# Patient Record
Sex: Female | Born: 1980 | Race: White | Hispanic: No | Marital: Married
Health system: Southern US, Community
[De-identification: ages and names within clinical notes are randomized; demographics above are authoritative.]

## PROBLEM LIST (undated history)

## (undated) ENCOUNTER — Emergency Department (HOSPITAL_COMMUNITY): Admission: EM | Payer: 59 | Source: Home / Self Care

## (undated) ENCOUNTER — Inpatient Hospital Stay (HOSPITAL_COMMUNITY): Payer: Self-pay

## (undated) DIAGNOSIS — R569 Unspecified convulsions: Secondary | ICD-10-CM

## (undated) DIAGNOSIS — G43909 Migraine, unspecified, not intractable, without status migrainosus: Secondary | ICD-10-CM

## (undated) DIAGNOSIS — N189 Chronic kidney disease, unspecified: Secondary | ICD-10-CM

## (undated) DIAGNOSIS — H52209 Unspecified astigmatism, unspecified eye: Secondary | ICD-10-CM

## (undated) DIAGNOSIS — K5090 Crohn's disease, unspecified, without complications: Secondary | ICD-10-CM

## (undated) DIAGNOSIS — G40909 Epilepsy, unspecified, not intractable, without status epilepticus: Secondary | ICD-10-CM

## (undated) DIAGNOSIS — N200 Calculus of kidney: Secondary | ICD-10-CM

## (undated) DIAGNOSIS — L309 Dermatitis, unspecified: Secondary | ICD-10-CM

## (undated) DIAGNOSIS — D649 Anemia, unspecified: Secondary | ICD-10-CM

## (undated) DIAGNOSIS — K8020 Calculus of gallbladder without cholecystitis without obstruction: Secondary | ICD-10-CM

## (undated) HISTORY — DX: Dermatitis, unspecified: L30.9

## (undated) HISTORY — DX: Unspecified astigmatism, unspecified eye: H52.209

## (undated) HISTORY — DX: Calculus of kidney: N20.0

## (undated) HISTORY — DX: Migraine, unspecified, not intractable, without status migrainosus: G43.909

---
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
## 2003-03-14 ENCOUNTER — Other Ambulatory Visit: Admission: RE | Admit: 2003-03-14 | Discharge: 2003-03-14 | Payer: Self-pay | Admitting: *Deleted

## 2004-02-22 ENCOUNTER — Ambulatory Visit (HOSPITAL_COMMUNITY): Admission: RE | Admit: 2004-02-22 | Discharge: 2004-02-22 | Payer: Self-pay | Admitting: Gastroenterology

## 2004-02-22 ENCOUNTER — Encounter (INDEPENDENT_AMBULATORY_CARE_PROVIDER_SITE_OTHER): Payer: Self-pay | Admitting: Specialist

## 2004-02-29 ENCOUNTER — Encounter: Admission: RE | Admit: 2004-02-29 | Discharge: 2004-02-29 | Payer: Self-pay | Admitting: Gastroenterology

## 2004-03-04 ENCOUNTER — Emergency Department (HOSPITAL_COMMUNITY): Admission: EM | Admit: 2004-03-04 | Discharge: 2004-03-04 | Payer: Self-pay | Admitting: *Deleted

## 2004-03-05 ENCOUNTER — Inpatient Hospital Stay (HOSPITAL_COMMUNITY): Admission: AD | Admit: 2004-03-05 | Discharge: 2004-03-09 | Payer: Self-pay | Admitting: Gastroenterology

## 2004-09-19 ENCOUNTER — Encounter: Admission: RE | Admit: 2004-09-19 | Discharge: 2004-09-19 | Payer: Self-pay | Admitting: Gastroenterology

## 2006-11-02 ENCOUNTER — Emergency Department (HOSPITAL_COMMUNITY): Admission: EM | Admit: 2006-11-02 | Discharge: 2006-11-03 | Payer: Self-pay | Admitting: *Deleted

## 2007-03-23 ENCOUNTER — Ambulatory Visit: Payer: Self-pay | Admitting: Internal Medicine

## 2007-03-23 LAB — CONVERTED CEMR LAB
ALT: 10 units/L (ref 0–40)
AST: 10 units/L (ref 0–37)
Albumin: 2.7 g/dL — ABNORMAL LOW (ref 3.5–5.2)
Alkaline Phosphatase: 61 units/L (ref 39–117)
Anti Nuclear Antibody(ANA): NEGATIVE
BUN: 3 mg/dL — ABNORMAL LOW (ref 6–23)
Basophils Absolute: 0.1 10*3/uL (ref 0.0–0.1)
Basophils Relative: 1.4 % — ABNORMAL HIGH (ref 0.0–1.0)
Bilirubin, Direct: 0.1 mg/dL (ref 0.0–0.3)
CO2: 29 meq/L (ref 19–32)
Calcium: 9.1 mg/dL (ref 8.4–10.5)
Chloride: 103 meq/L (ref 96–112)
Cholesterol: 184 mg/dL (ref 0–200)
Creatinine, Ser: 0.6 mg/dL (ref 0.4–1.2)
Crystals: NEGATIVE
Eosinophils Absolute: 1.4 10*3/uL — ABNORMAL HIGH (ref 0.0–0.6)
Eosinophils Relative: 15.9 % — ABNORMAL HIGH (ref 0.0–5.0)
Folate: 7.7 ng/mL
GFR calc Af Amer: 159 mL/min
GFR calc non Af Amer: 132 mL/min
Glucose, Bld: 102 mg/dL — ABNORMAL HIGH (ref 70–99)
HCT: 30.6 % — ABNORMAL LOW (ref 36.0–46.0)
HDL: 54 mg/dL (ref >39.0)
Hemoglobin, Urine: NEGATIVE
Hemoglobin: 9.9 g/dL — ABNORMAL LOW (ref 12.0–15.0)
Iron: 15 ug/dL — ABNORMAL LOW (ref 42–145)
LDL Cholesterol: 116 mg/dL — ABNORMAL HIGH (ref 0–99)
Lymphocytes Relative: 13 % (ref 12.0–46.0)
MCHC: 32.3 g/dL (ref 30.0–36.0)
MCV: 76.5 fL — ABNORMAL LOW (ref 78.0–100.0)
Monocytes Absolute: 1 10*3/uL — ABNORMAL HIGH (ref 0.2–0.7)
Monocytes Relative: 12.2 % — ABNORMAL HIGH (ref 3.0–11.0)
Neutro Abs: 4.9 10*3/uL (ref 1.4–7.7)
Neutrophils Relative %: 57.5 % (ref 43.0–77.0)
Nitrite: NEGATIVE
Platelets: 773 10*3/uL — ABNORMAL HIGH (ref 150–400)
Potassium: 4.5 meq/L (ref 3.5–5.1)
RBC: 4 M/uL (ref 3.87–5.11)
RDW: 15.2 % — ABNORMAL HIGH (ref 11.5–14.6)
Rheumatoid fact SerPl-aCnc: <20 intl units/mL — ABNORMAL LOW (ref 0.0–20.0)
Saturation Ratios: 8.4 % — ABNORMAL LOW (ref 20.0–50.0)
Sed Rate: 79 mm/hr — ABNORMAL HIGH (ref 0–25)
Sodium: 138 meq/L (ref 135–145)
Specific Gravity, Urine: 1.01 (ref 1.000–1.03)
TSH: 1.04 microintl units/mL (ref 0.35–5.50)
Total Bilirubin: 0.4 mg/dL (ref 0.3–1.2)
Total CHOL/HDL Ratio: 3.4
Total Protein: 7.3 g/dL (ref 6.0–8.3)
Transferrin: 127.4 mg/dL — ABNORMAL LOW (ref >=212.0)
Triglycerides: 68 mg/dL (ref 0–149)
Urine Glucose: NEGATIVE mg/dL
Urobilinogen, UA: 0.2 (ref 0.0–1.0)
VLDL: 14 mg/dL (ref 0–40)
Vitamin B-12: 407 pg/mL (ref 211–911)
WBC: 8.5 10*3/uL (ref 4.5–10.5)
ds DNA Ab: 1 (ref <5)
pH: 8 (ref 5.0–8.0)

## 2007-04-22 ENCOUNTER — Ambulatory Visit: Payer: Self-pay | Admitting: Internal Medicine

## 2007-04-22 LAB — CONVERTED CEMR LAB
ALT: 7 units/L (ref 0–35)
AST: 11 units/L (ref 0–37)
Albumin: 2.7 g/dL — ABNORMAL LOW (ref 3.5–5.2)
Alkaline Phosphatase: 52 units/L (ref 39–117)
Basophils Absolute: 0.1 10*3/uL (ref 0.0–0.1)
Basophils Relative: 1.5 % — ABNORMAL HIGH (ref 0.0–1.0)
Bilirubin, Direct: 0.1 mg/dL (ref 0.0–0.3)
Eosinophils Absolute: 1 10*3/uL — ABNORMAL HIGH (ref 0.0–0.6)
Eosinophils Relative: 15.8 % — ABNORMAL HIGH (ref 0.0–5.0)
HCT: 28.6 % — ABNORMAL LOW (ref 36.0–46.0)
Hemoglobin: 9.1 g/dL — ABNORMAL LOW (ref 12.0–15.0)
Iron: 50 ug/dL (ref 42–145)
Lymphocytes Relative: 17.2 % (ref 12.0–46.0)
MCHC: 31.9 g/dL (ref 30.0–36.0)
MCV: 78.8 fL (ref 78.0–100.0)
Monocytes Absolute: 0.8 10*3/uL — ABNORMAL HIGH (ref 0.2–0.7)
Monocytes Relative: 12 % — ABNORMAL HIGH (ref 3.0–11.0)
Neutro Abs: 3.3 10*3/uL (ref 1.4–7.7)
Neutrophils Relative %: 53.5 % (ref 43.0–77.0)
Platelets: 721 10*3/uL — ABNORMAL HIGH (ref 150–400)
RBC: 3.63 M/uL — ABNORMAL LOW (ref 3.87–5.11)
RDW: 18.6 % — ABNORMAL HIGH (ref 11.5–14.6)
Saturation Ratios: 27.5 % (ref 20.0–50.0)
Total Bilirubin: 0.3 mg/dL (ref 0.3–1.2)
Total Protein: 6.9 g/dL (ref 6.0–8.3)
Transferrin: 129.8 mg/dL — ABNORMAL LOW (ref >=212.0)
WBC: 6.3 10*3/uL (ref 4.5–10.5)

## 2007-04-28 ENCOUNTER — Encounter: Payer: Self-pay | Admitting: Internal Medicine

## 2007-04-28 DIAGNOSIS — K5090 Crohn's disease, unspecified, without complications: Secondary | ICD-10-CM | POA: Insufficient documentation

## 2007-04-28 DIAGNOSIS — D509 Iron deficiency anemia, unspecified: Secondary | ICD-10-CM | POA: Insufficient documentation

## 2007-04-28 DIAGNOSIS — R569 Unspecified convulsions: Secondary | ICD-10-CM

## 2007-04-28 DIAGNOSIS — R519 Headache, unspecified: Secondary | ICD-10-CM | POA: Insufficient documentation

## 2007-04-28 DIAGNOSIS — R51 Headache: Secondary | ICD-10-CM

## 2007-04-28 DIAGNOSIS — K219 Gastro-esophageal reflux disease without esophagitis: Secondary | ICD-10-CM | POA: Insufficient documentation

## 2007-05-11 ENCOUNTER — Ambulatory Visit: Payer: Self-pay | Admitting: Internal Medicine

## 2007-08-06 ENCOUNTER — Other Ambulatory Visit: Admission: RE | Admit: 2007-08-06 | Discharge: 2007-08-06 | Payer: Self-pay | Admitting: Obstetrics & Gynecology

## 2007-09-30 LAB — CONVERTED CEMR LAB: Pap Smear: NORMAL

## 2008-03-11 ENCOUNTER — Emergency Department (HOSPITAL_COMMUNITY): Admission: EM | Admit: 2008-03-11 | Discharge: 2008-03-11 | Payer: Self-pay | Admitting: Emergency Medicine

## 2008-07-05 ENCOUNTER — Ambulatory Visit: Payer: Self-pay | Admitting: Internal Medicine

## 2008-07-05 DIAGNOSIS — M25519 Pain in unspecified shoulder: Secondary | ICD-10-CM | POA: Insufficient documentation

## 2008-10-05 ENCOUNTER — Ambulatory Visit: Payer: Self-pay | Admitting: Internal Medicine

## 2008-10-05 DIAGNOSIS — J209 Acute bronchitis, unspecified: Secondary | ICD-10-CM

## 2008-10-11 ENCOUNTER — Ambulatory Visit (HOSPITAL_COMMUNITY): Admission: RE | Admit: 2008-10-11 | Discharge: 2008-10-11 | Payer: Self-pay

## 2009-01-27 LAB — CONVERTED CEMR LAB: Pap Smear: NORMAL

## 2010-01-07 ENCOUNTER — Ambulatory Visit: Payer: Self-pay | Admitting: Internal Medicine

## 2010-01-07 DIAGNOSIS — N63 Unspecified lump in unspecified breast: Secondary | ICD-10-CM

## 2010-01-07 DIAGNOSIS — H538 Other visual disturbances: Secondary | ICD-10-CM

## 2010-01-07 LAB — CONVERTED CEMR LAB
ALT: 13 units/L (ref 0–35)
AST: 18 units/L (ref 0–37)
Albumin: 4.4 g/dL (ref 3.5–5.2)
Alkaline Phosphatase: 30 units/L — ABNORMAL LOW (ref 39–117)
Basophils Absolute: 0.1 10*3/uL (ref 0.0–0.1)
Basophils Relative: 1 % (ref 0.0–3.0)
Bilirubin Urine: NEGATIVE
Bilirubin, Direct: 0.1 mg/dL (ref 0.0–0.3)
CO2: 30 meq/L (ref 19–32)
Chloride: 99 meq/L (ref 96–112)
Creatinine, Ser: 0.6 mg/dL (ref 0.4–1.2)
Eosinophils Absolute: 0.2 10*3/uL (ref 0.0–0.7)
Eosinophils Relative: 2.2 % (ref 0.0–5.0)
Folate: 18.8 ng/mL
GFR calc non Af Amer: 128.01 mL/min (ref >60)
Glucose, Bld: 83 mg/dL (ref 70–99)
HCT: 35.5 % — ABNORMAL LOW (ref 36.0–46.0)
HDL: 83.5 mg/dL (ref >39.00)
Hemoglobin: 11.9 g/dL — ABNORMAL LOW (ref 12.0–15.0)
Iron: 52 ug/dL (ref 42–145)
Ketones, ur: NEGATIVE mg/dL
Leukocytes, UA: NEGATIVE
Lymphocytes Relative: 21.1 % (ref 12.0–46.0)
Lymphs Abs: 1.6 10*3/uL (ref 0.7–4.0)
MCHC: 33.6 g/dL (ref 30.0–36.0)
MCV: 94.6 fL (ref 78.0–100.0)
Monocytes Absolute: 0.6 10*3/uL (ref 0.1–1.0)
Monocytes Relative: 8.4 % (ref 3.0–12.0)
Neutro Abs: 5.1 10*3/uL (ref 1.4–7.7)
Neutrophils Relative %: 67.3 % (ref 43.0–77.0)
Nitrite: NEGATIVE
Potassium: 3.3 meq/L — ABNORMAL LOW (ref 3.5–5.1)
RBC: 3.75 M/uL — ABNORMAL LOW (ref 3.87–5.11)
RDW: 15.5 % — ABNORMAL HIGH (ref 11.5–14.6)
Saturation Ratios: 14.7 % — ABNORMAL LOW (ref 20.0–50.0)
Sed Rate: 19 mm/hr (ref 0–22)
Sodium: 138 meq/L (ref 135–145)
Specific Gravity, Urine: >=1.03 (ref 1.000–1.030)
Total Bilirubin: 0.6 mg/dL (ref 0.3–1.2)
Total CHOL/HDL Ratio: 2
Total Protein, Urine: NEGATIVE mg/dL
Total Protein: 8.3 g/dL (ref 6.0–8.3)
Transferrin: 252.2 mg/dL (ref 212.0–360.0)
Triglycerides: 38 mg/dL (ref 0.0–149.0)
Urine Glucose: NEGATIVE mg/dL
Urobilinogen, UA: 0.2 (ref 0.0–1.0)
VLDL: 7.6 mg/dL (ref 0.0–40.0)
Vitamin B-12: 233 pg/mL (ref 211–911)
WBC: 7.6 10*3/uL (ref 4.5–10.5)
pH: 5 (ref 5.0–8.0)

## 2010-01-09 LAB — CONVERTED CEMR LAB
Chlamydia, Swab/Urine, PCR: NEGATIVE
GC Probe Amp, Urine: NEGATIVE

## 2010-01-14 ENCOUNTER — Encounter: Admission: RE | Admit: 2010-01-14 | Discharge: 2010-01-14 | Payer: Self-pay | Admitting: Internal Medicine

## 2010-01-28 ENCOUNTER — Encounter: Payer: Self-pay | Admitting: Internal Medicine

## 2010-10-29 NOTE — Assessment & Plan Note (Signed)
Summary: CPX / NWS  #   Vital Signs:  Patient profile:   30 year old female Height:      59 inches Weight:      105.75 pounds BMI:     21.44 O2 Sat:      99 % on Room air Temp:     98.2 degrees F oral Pulse rate:   74 / minute BP sitting:   100 / 60  (left arm) Cuff size:   regular  Vitals Entered ByZella Ball Ewing (January 07, 2010 1:06 PM)  O2 Flow:  Room air  Preventive Care Screening  Pap Smear:    Date:  09/30/2007    Results:  normal   Last Tetanus Booster:    Date:  12/29/2002    Results:  Tdap   CC: Adult Physical/RE   CC:  Adult Physical/RE.  History of Present Illness: gets cbc every 3 mo with GI MD - on humira and immuran;  crohn's doing well;  no overt bleeding or pain or diarrhea, no wt loss.  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    Problems Prior to Update: 1)  Breast Mass, Right  (ICD-611.72) 2)  Preventive Health Care  (ICD-V70.0) 3)  Sexually Transmitted Disease, Exposure To  (ICD-V01.6) 4)  Asthmatic Bronchitis, Acute  (ICD-466.0) 5)  Shoulder Pain, Right  (ICD-719.41) 6)  Crohn's Disease  (ICD-555.9) 7)  Seizure Disorder  (ICD-780.39) 8)  Headache  (ICD-784.0) 9)  Gerd  (ICD-530.81) 10)  Anemia-iron Deficiency  (ICD-280.9)  Medications Prior to Update: 1)  Tegretol Xr 100 Mg Xr12h-Tab (Carbamazepine) .Marland Kitchen.. 1 1/2 Tabs By Mouth Bid 2)  Humira 20 Mg/0.55ml Kit (Adalimumab) .... Inj Q 2 Weeks 3)  Imuran 50 Mg Tabs  (Azathioprine) .... 2 By Mouth Daily 4)  Cephalexin 500 Mg Tabs (Cephalexin) .Marland Kitchen.. 1 By Mouth Three Times A Day 5)  Prednisone 10 Mg Tabs (Prednisone) .... 3po Qd For 3days, Then 2po Qd For 3days, Then 1po Qd For 3days, Then Stop 6)  Tessalon Perles 100 Mg Caps (Benzonatate) .Marland Kitchen.. 1 -2 By Mouth Three Times A Day As Needed Cough  Current Medications (verified): 1)  Tegretol Xr 100 Mg Xr12h-Tab (Carbamazepine) .Marland Kitchen.. 1 1/2 Tabs By Mouth Bid 2)  Humira 20 Mg/0.56ml Kit (Adalimumab) .... Inj Q 2 Weeks 3)  Imuran 50 Mg Tabs (Azathioprine) .... 2 By Mouth Daily 4)  Cephalexin 500 Mg Tabs (Cephalexin) .Marland Kitchen.. 1 By Mouth Three Times A Day 5)  Prednisone 10 Mg Tabs (Prednisone) .... 3po Qd For 3days, Then 2po Qd For 3days, Then 1po Qd For 3days, Then Stop 6)  Tessalon Perles 100 Mg Caps (Benzonatate) .Marland Kitchen.. 1 -2 By Mouth Three Times A Day As Needed Cough 7)  Lamictal Xr 200 Mg Xr24h-Tab (Lamotrigine) .Marland Kitchen.. 1 By Mouth Once Daily 8)  Loestrin 24 Fe 1-20 Mg-Mcg Tabs (  Norethin Ace-Eth Estrad-Fe) .Marland Kitchen.. 1 By Mouth Once Daily  Allergies (verified): 1)  ! 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 Denies surgical history  Family History: Reviewed history and no changes required. p-grandmother died with stroke p-grandfather with MI at 56yo paternal - mult with HTN, elev chol maternal - - several with heart disease, elev  chol  Social History: Reviewed history from 07/05/2008 and no changes required. 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         all otherwise negative per pt -    Physical Exam  General:  alert and well-developed.   Head:  normocephalic and atraumatic.   Eyes:  vision grossly intact, pupils equal, and pupils round.   Ears:  R ear normal and L ear normal.   Nose:  no external deformity and no nasal discharge.   Mouth:  no gingival abnormalities and pharynx pink and moist.   Neck:  supple and no masses.   Breasts:  right and left breasts with prob cystic changes multiple, with one tender prob cystic area to the right upper outer quad Lungs:  normal respiratory effort and normal breath sounds.   Heart:  normal rate and regular rhythm.   Abdomen:  soft, non-tender, and normal bowel sounds.   Msk:  no joint tenderness and no joint swelling.   Extremities:  no edema, no erythema  Neurologic:  cranial nerves II-XII intact and strength normal in all extremities.   Skin:  color normal and no rashes.   Psych:  normally interactive and moderately anxious.     Impression & Recommendations:  Problem # 1:  Preventive Health Care (ICD-V70.0)  Overall doing well, age appropriate education and counseling updated and referral for appropriate preventive services done unless declined, immunizations up to date or declined, diet counseling done if overweight, urged to quit smoking if smokes ,  most recent labs reviewed and current ordered if appropriate, ecg reviewed or declined (interpretation per ECG scanned in the EMR if done); information regarding Medicare Prevention requirements given if appropriate  Orders: TLB-BMP (Basic Metabolic Panel-BMET) (80048-METABOL) TLB-CBC Platelet - w/Differential (85025-CBCD) TLB-Hepatic/Liver Function Pnl (80076-HEPATIC) TLB-Lipid Panel (80061-LIPID) TLB-TSH (Thyroid Stimulating Hormone) (84443-TSH) TLB-Udip ONLY (81003-UDIP)  Problem # 2:  SEXUALLY TRANSMITTED DISEASE, EXPOSURE TO (ICD-V01.6) to check per pt Orders: T-HIV-1 (Screen) [id number]) T-RPR (Syphilis) [phone]) T-Chlamydia & GC Probe, Urine ([id number]) T-Herpes Simplex Type 2 (56213-08657)  Problem # 3:  BREAST MASS, RIGHT (ICD-611.72)  exam c/w prob fibrocystic changes;  with tendern mass to the right;  will ask for diag mammogram with u/s with an eye towards possible  aspiration if needed;  she became tearful and emotional with this suggestionl, I tried to reassure this is very very unlikely to be malignancy  Orders: Radiology Referral (Radiology)  Problem # 4:  BLURRED VISION (ICD-368.8)  pt reqeusts second opinion  Orders: Ophthalmology Referral (Ophthalmology)  Complete Medication List: 1)  Tegretol Xr 100 Mg Xr12h-tab (Carbamazepine) .Marland Kitchen.. 1 1/2 tabs by mouth bid 2)  Humira 20 Mg/0.31ml Kit (Adalimumab) .... Inj q 2 weeks 3)  Imuran 50 Mg Tabs (Azathioprine) .... 2 by mouth daily 4)  Cephalexin 500 Mg Tabs (Cephalexin) .Marland Kitchen.. 1 by mouth three times a day 5)  Prednisone 10 Mg Tabs (Prednisone) .... 3po qd for 3days, then 2po qd for 3days, then 1po qd for 3days, then stop 6)  Tessalon Perles 100 Mg Caps (Benzonatate) .Marland Kitchen.. 1 -2 by mouth three times a day as needed cough 7)  Lamictal Xr 200 Mg Xr24h-tab (Lamotrigine) .Marland Kitchen.. 1 by mouth once daily 8)  Loestrin 24 Fe 1-20 Mg-mcg Tabs (Norethin ace-eth estrad-fe) .Marland Kitchen.. 1 by mouth once daily  Other Orders: TLB-B12 +  Folate Pnl ([id number]) TLB-IBC Pnl (Iron/FE;Transferrin) (83550-IBC) TLB-Sedimentation Rate (ESR) (85652-ESR)  Patient Instructions: 1)  Please go to the Lab in the basement for your blood and/or urine tests today 2)  You will be contacted about the referral(s) to: diagnostic mammogram  (to look into the probable Fibrocystic breast changes), and the opthomologist 3)  Please schedule a follow-up appointment in 1 year or sooner if needed

## 2010-10-29 NOTE — Consult Note (Signed)
Summary: [facility]  [facility]   Imported By: Bubba Hales 01/30/2010 09:01:43  _____________________________________________________________________  External Attachment:    Type:   Image     Comment:   External Document

## 2011-02-14 NOTE — Consult Note (Signed)
NAME:  Desiree Marshall, Desiree Marshall                         ACCOUNT NO.:  [id number]   MEDICAL RECORD NO.:  [id number]                   PATIENT TYPE:  INP   LOCATION:  3006                                 FACILITY:  [facility]   PHYSICIAN:  Nelwyn Salisbury, M.D.               DATE OF BIRTH:  17-Nov-1980   DATE OF CONSULTATION:  03/08/2004  DATE OF DISCHARGE:  03/09/2004                                   CONSULTATION   REASON FOR CONSULTATION:  Severe Crohn's colitis with acute flareup and  dehydration with nausea and vomiting, fevers up to 103.   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  She was treated by Nida Boatman Dike,  P.A. for IBS and was given Levbid; however, simple measures did not help her  and she was therefore referred for further evaluation.  She describes loose  mucoid stools since December of 2003 with decreased appetite.  She has  attributed this to IBS as she had a stressful year at school this year.  These symptoms were associated with lower abdominal cramping and bloating.  She is noted low grade fevers in the past but over the weekend developed a  fever of 104 after she was started on Pentasa.  She was seen in the  emergency room yesterday where a CT scan showed terminal ileitis in the  bowels and small intestines involved with Crohn's disease.  She was  therefore admitted for IV steroids as she could not keep fluids down.  She  also has a history of headaches for which she takes Aleve and a history of  seizure disorder since age 56 for which she is on Tegretol 300 mg b.i.d.  followed by Dr. Princess Bruins. [place].   ALLERGIES:  No known drug allergies.   MEDICATIONS:  Aleve, Allegra, Tegretol, and Levbid.   PAST MEDICAL HISTORY:  1. History of back pain and back spasm.  2. History of epilepsy.  3. History of Crohn's colitis diagnosed  in the recent past.   PAST SURGICAL HISTORY:  The patient has had no surgeries.   SOCIAL HISTORY:  She is a Presenter, broadcasting at [employer] and lives with her  brother.  Her parents live in [place].  She has a very supportive family.  She exercises regularly one to three times per week.  She denies the use of  alcohol, tobacco, or drugs.  She is not married and has no children.  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   FAMILY HISTORY:  History of colonic polyps associated with her father.  MI  associated with a paternal grandfather.  There is no known family history of  colon, breast, ovarian, cervical, or uterine cancer.  There is no family  history of  IBD.   REVIEW OF SYMPTOMS:  Nausea and vomiting.  Fevers up to 104.  Diarrhea with  mucoid stool.  Acute abdominal pain.  No history of melena or hematochezia.   PHYSICAL EXAMINATION:  GENERAL:  A very pleasant, young, anxious, and  dehydrated-appearing white female in no acute distress.  VITAL SIGNS:  Temperature of 98.6, blood pressure 110/70, pulse of 125 per  minute, and respiratory rate of 18.  HEENT:  TMs sequelae.  The patient's oropharyngeal mucosa without exudate.  NECK:  Supple.  No JVD, thyromegaly, or lymphadenopathy.  CHEST:  Clear to auscultation.  HEART:  S1 and S2 regular.  The patient is tachycardic with no rales,  rhonchi, or wheezing.  ABDOMEN:  Soft.  Diffusely tender with no hepatosplenomegaly.  RECTAL:  Deferred.   LABORATORY DATA:  Deferred until the morning.  CT scan done yesterday showed  thickening of the terminal ileum and other loop of small bowel.  Colonoscopy  done in the recent past showed pancolitis from Crohn's disease.  Terminal  ileus was not visualized during colonoscopy.   ASSESSMENT/PLAN:  1. Crohn's colitis with small bowel involvement:  We will start intravenous     Solu-Medrol 30 mg q.6h. tonight, along with intravenous Zestril for     symptomatic relief.  Give the patient two  to three liters of fluid to     stabilize her symptoms.  Further recommendation made thereafter.  Pentasa     will be added once the patient is more stable on Solu-Medrol.  2. Seizure disorder on Tegretol 300 mg b.i.d.  3. History of headaches on Aleve.  All nonsteroidals are discouraged for     now.                                               Nelwyn Salisbury, M.D.    JNM/MEDQ  D:  03/08/2004  T:  03/09/2004  Job:  [id number]   cc:   Suszanne Conners, M.D.  [place]  [place] [address]  Fax: [phone]   Princess Bruins. Gaynell Face, M.D.  [address] [place] [address]  Fax: [phone]

## 2011-02-14 NOTE — Op Note (Signed)
NAME:  Desiree Marshall, Desiree Marshall                         ACCOUNT NO.:  [id number]   MEDICAL RECORD NO.:  [id number]                   PATIENT TYPE:  AMB   LOCATION:  ENDO                                 FACILITY:  [facility]   PHYSICIAN:  Nelwyn Salisbury, M.D.               DATE OF BIRTH:  1981-04-11   DATE OF PROCEDURE:  02/22/2004  DATE OF DISCHARGE:                                 OPERATIVE REPORT   PROCEDURE PERFORMED:  Colonoscopy with multiple biopsies.   ENDOSCOPIST:  Juanita Craver, M.D.   INSTRUMENT USED:  Olympus adjustable pediatric video colonoscope.   INDICATIONS FOR PROCEDURE:  Change in bowel habits with blood in stool and  worsening diarrhea in a 30 year old white female with an elevated ESR of 60  and anemia with hemoglobin of 9.3 gm/dl.  Rule out inflammatory bowel  disease, colonic polyps, etc.   PREPROCEDURE PREPARATION:  Informed consent was procured from the patient.  The patient was fasted for eight hours prior to the procedure and prepped  with a bottle of magnesium citrate and a gallon of GoLYTELY the night prior  to the procedure.   PREPROCEDURE PHYSICAL:  The patient had stable vital signs.  Neck supple.  Chest clear to auscultation.  S1 and S2 regular.  Abdomen soft with normal  bowel sounds.   DESCRIPTION OF PROCEDURE:  The patient was placed in left lateral decubitus  position and sedated with 100 mg of Demerol and 10 mg of Versed in slow  incremental doses.  Once the patient was adequately sedated and maintained  on low flow oxygen and continuous cardiac monitoring, the Olympus video  colonoscope was advanced from the rectum to the cecum without difficulty.  There was some residual stool in the colon especially in the cecum and on  the right side of the colon.  Patchy ulceration with exudate was noted  throughout the colon and multiple biopsies were done.  The rectum from 0 to  10 cm seemed to be spared of these changes.  The terminal ileum was not  visualized in  spite of several efforts to do so.  Retroflexion in the rectum  revealed no abnormalities.  The patient tolerated the procedure well without  immediate complications.   IMPRESSION:  1. Patchy ulceration throughout the colon consistent with Crohn's colitis.     Normal-appearing mucosa from 0 to 10 cm.  2. Random biopsies done, results pending.  3. Residual stool in the right colon, terminal ileum not visualized.   RECOMMENDATIONS:  1. An upper GI with small bowel follow-through is planned to further     evaluate the rest of the GI tract with regard to Crohn's disease.  2. Await pathology results.  3. Avoid all nonsteroidals for now.  4. Outpatient followup within one week for further recommendations.  Nelwyn Salisbury, M.D.    JNM/MEDQ  D:  02/22/2004  T:  02/23/2004  Job:  [id number]   cc:   Suszanne Conners, M.D.  [place]  [place] [address]  Fax: [phone]

## 2011-02-14 NOTE — Discharge Summary (Signed)
NAME:  Desiree Marshall, Desiree Marshall                         ACCOUNT NO.:  [id number]   MEDICAL RECORD NO.:  [id number]                   PATIENT TYPE:  INP   LOCATION:  3006                                 FACILITY:  [facility]   PHYSICIAN:  Nelwyn Salisbury, M.D.               DATE OF BIRTH:  [birth year]   DATE OF ADMISSION:  03/05/2004  DATE OF DISCHARGE:  03/09/2004                                 DISCHARGE SUMMARY   REASON FOR ADMISSION:  1. Flare-up of Crohn's colitis.  2. History of epilepsy, on Tegretol.  3. History of back pain and spasms.  4. History of headaches.   DISCHARGE DIAGNOSES:  1. Crohn's colitis.  2. Epilepsy.  3. Back pain, improved.  4. History of migraine headaches.   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  She subsequently underwent a colonoscopy with biopsies which  proved her condition to be secondary to Crohn's disease.  She had been  treated with antispasmodics for presumed IBS the year prior to her  diagnosis.  However, her symptoms worsened.  She was tried on Pentasa and  seemed to have an allergic reaction to it.  She was therefore advised to  come to the emergency room.  A day prior to admission the CAT scan revealed  ileitis with __________ Crohn's disease and the patient had severe nausea  and abdominal discomfort after starting the first dose of Pentasa.  However,  she was not sure whether this was from her headache or from the Crohn's.  She was therefore hospitalized on March 05, 2004 for IV fluids and a challenge  with Pentasa.  She did well and her symptoms gradually improved on Solu-  Medrol.  She was continued on Tegretol for her seizure disorder and was  encouraged to avoid steroids as these might worsen her Crohn's colitis.  She  seemed to have intolerance for Phenergan and therefore Zofran was used.  On  admission her labs revealed a  white count of 7.3, hemoglobin of 8.6,  hematocrit 26.7, and platelets of 573.  She was maintained on Protonix and  before discharge was started on ferrous sulfate for iron deficiency anemia.  She was to take 324 mg three times a day.  The patient's abdominal pain  improved and so did the nausea.  Pentasa was given to her three times a day  and she was discharged on March 09, 2004.   DISCHARGE MEDICATIONS:  1. Tegretol 300 mg b.i.d.  2. Pentasa 250 mg two pills three times a day.  3. Tylenol p.r.n. for headaches.  4. Prednisone taper starting at 40 mg per day to be decreased to 10 mg per     day over the next 4 weeks.  5. Ferrous sulfate 324 mg three times a day.  DISCHARGE INSTRUCTIONS:  The patient is to follow up with me in the office  within the next 2 weeks.  To call the office earlier if she is having  problems in the interim.  I have contacted her attending at [facility] and  informed her about her condition so that she does not suffer with regards to  her nursing education.  Further recommendations will be made on outpatient  follow-up.                                                Nelwyn Salisbury, M.D.    JNM/MEDQ  D:  05/08/2004  T:  05/08/2004  Job:  [id number]   cc:   Suszanne Conners, M.D.  [place]  [place] [address]  Fax: [phone]   Princess Bruins. Gaynell Face, M.D.  [address] [place] [address]  Fax: [phone]

## 2011-04-08 DIAGNOSIS — K5090 Crohn's disease, unspecified, without complications: Secondary | ICD-10-CM | POA: Insufficient documentation

## 2011-05-02 DIAGNOSIS — K644 Residual hemorrhoidal skin tags: Secondary | ICD-10-CM | POA: Insufficient documentation

## 2011-06-26 ENCOUNTER — Other Ambulatory Visit: Payer: Self-pay | Admitting: Obstetrics and Gynecology

## 2011-06-26 LAB — POCT I-STAT, CHEM 8
BUN: 7
Calcium, Ion: 1.13
Chloride: 105
Creatinine, Ser: 0.5
Glucose, Bld: 93
HCT: 38
Hemoglobin: 12.9
Potassium: 3.7
Sodium: 136
TCO2: 18

## 2011-06-26 LAB — URINE MICROSCOPIC-ADD ON

## 2011-06-26 LAB — CBC
HCT: 34.2 — ABNORMAL LOW
Hemoglobin: 11.5 — ABNORMAL LOW
MCHC: 33.7
MCV: 83.4
Platelets: 392
RBC: 4.1
RDW: 17.4 — ABNORMAL HIGH
WBC: 4.6

## 2011-06-26 LAB — URINALYSIS, ROUTINE W REFLEX MICROSCOPIC
Bilirubin Urine: NEGATIVE
Glucose, UA: NEGATIVE
Ketones, ur: 15 — AB
Nitrite: NEGATIVE
Protein, ur: 30 — AB
Specific Gravity, Urine: 1.022
Urobilinogen, UA: 1
pH: 7.5

## 2011-06-26 LAB — DIFFERENTIAL
Basophils Absolute: 0.1
Basophils Relative: 2 — ABNORMAL HIGH
Eosinophils Absolute: 0.2
Eosinophils Relative: 5
Lymphocytes Relative: 32
Lymphs Abs: 1.5
Monocytes Absolute: 0.8
Monocytes Relative: 18 — ABNORMAL HIGH
Neutro Abs: 2
Neutrophils Relative %: 44

## 2011-06-26 LAB — CARBAMAZEPINE LEVEL, TOTAL: Carbamazepine Lvl: <2 — ABNORMAL LOW

## 2011-10-31 LAB — OB RESULTS CONSOLE GC/CHLAMYDIA
Chlamydia: NEGATIVE
Gonorrhea: NEGATIVE

## 2011-10-31 LAB — OB RESULTS CONSOLE RUBELLA ANTIBODY, IGM: Rubella: IMMUNE

## 2011-10-31 LAB — OB RESULTS CONSOLE ANTIBODY SCREEN: Antibody Screen: NEGATIVE

## 2012-04-18 ENCOUNTER — Inpatient Hospital Stay (HOSPITAL_COMMUNITY): Payer: 59 | Admitting: Anesthesiology

## 2012-04-18 ENCOUNTER — Encounter (HOSPITAL_COMMUNITY): Payer: Self-pay | Admitting: *Deleted

## 2012-04-18 ENCOUNTER — Encounter (HOSPITAL_COMMUNITY): Payer: Self-pay | Admitting: Anesthesiology

## 2012-04-18 ENCOUNTER — Inpatient Hospital Stay (HOSPITAL_COMMUNITY)
Admission: AD | Admit: 2012-04-18 | Discharge: 2012-04-20 | DRG: 775 | Disposition: A | Discharge status: Home / Self Care | Payer: 59 | Source: Ambulatory Visit | Attending: Obstetrics and Gynecology | Admitting: Obstetrics and Gynecology

## 2012-04-18 DIAGNOSIS — O99354 Diseases of the nervous system complicating childbirth: Secondary | ICD-10-CM | POA: Diagnosis present

## 2012-04-18 DIAGNOSIS — O99892 Other specified diseases and conditions complicating childbirth: Secondary | ICD-10-CM | POA: Diagnosis present

## 2012-04-18 DIAGNOSIS — G40909 Epilepsy, unspecified, not intractable, without status epilepticus: Secondary | ICD-10-CM | POA: Diagnosis present

## 2012-04-18 DIAGNOSIS — Z3490 Encounter for supervision of normal pregnancy, unspecified, unspecified trimester: Secondary | ICD-10-CM

## 2012-04-18 DIAGNOSIS — O9902 Anemia complicating childbirth: Secondary | ICD-10-CM | POA: Diagnosis present

## 2012-04-18 DIAGNOSIS — K5090 Crohn's disease, unspecified, without complications: Secondary | ICD-10-CM | POA: Diagnosis present

## 2012-04-18 DIAGNOSIS — O4290 Premature rupture of membranes, unspecified as to length of time between rupture and onset of labor, unspecified weeks of gestation: Secondary | ICD-10-CM | POA: Diagnosis present

## 2012-04-18 DIAGNOSIS — D649 Anemia, unspecified: Secondary | ICD-10-CM | POA: Diagnosis present

## 2012-04-18 HISTORY — DX: Epilepsy, unspecified, not intractable, without status epilepticus: G40.909

## 2012-04-18 HISTORY — DX: Crohn's disease, unspecified, without complications: K50.90

## 2012-04-18 HISTORY — DX: Anemia, unspecified: D64.9

## 2012-04-18 LAB — CBC
HCT: 38.3 % (ref 36.0–46.0)
Hemoglobin: 12.6 g/dL (ref 12.0–15.0)
MCHC: 32.9 g/dL (ref 30.0–36.0)

## 2012-04-18 LAB — POCT FERN TEST: Fern Test: POSITIVE

## 2012-04-18 MED ORDER — ONDANSETRON HCL 4 MG/2ML IJ SOLN
4.0000 mg | Freq: Four times a day (QID) | INTRAMUSCULAR | Status: DC | PRN
  Administered 2012-04-18: 4 mg via INTRAVENOUS
  Filled 2012-04-18: qty 2

## 2012-04-18 MED ORDER — CITRIC ACID-SODIUM CITRATE 334-500 MG/5ML PO SOLN
30.0000 mL | ORAL | Status: DC | PRN
  Filled 2012-04-18: qty 15

## 2012-04-18 MED ORDER — PENICILLIN G POTASSIUM 5000000 UNITS IJ SOLR
2.5000 10*6.[IU] | INTRAVENOUS | Status: DC
  Administered 2012-04-18: 2.5 10*6.[IU] via INTRAVENOUS
  Filled 2012-04-18 (×4): qty 2.5

## 2012-04-18 MED ORDER — LIDOCAINE HCL (PF) 1 % IJ SOLN
30.0000 mL | INTRAMUSCULAR | Status: DC | PRN
  Filled 2012-04-18: qty 30

## 2012-04-18 MED ORDER — LACTATED RINGERS IV SOLN
500.0000 mL | Freq: Once | INTRAVENOUS | Status: AC
  Administered 2012-04-18: 500 mL via INTRAVENOUS

## 2012-04-18 MED ORDER — EPHEDRINE 5 MG/ML INJ
10.0000 mg | INTRAVENOUS | Status: DC | PRN
Start: 2012-04-18 — End: 2012-04-19

## 2012-04-18 MED ORDER — FLEET ENEMA 7-19 GM/118ML RE ENEM: 1.0000 | ENEMA | RECTAL | Status: DC | PRN

## 2012-04-18 MED ORDER — OXYCODONE-ACETAMINOPHEN 5-325 MG PO TABS: 1.0000 | ORAL_TABLET | ORAL | Status: DC | PRN

## 2012-04-18 MED ORDER — TERBUTALINE SULFATE 1 MG/ML IJ SOLN: 0.2500 mg | Freq: Once | INTRAMUSCULAR | Status: AC | PRN

## 2012-04-18 MED ORDER — LACTATED RINGERS IV SOLN
INTRAVENOUS | Status: DC
  Administered 2012-04-18 (×2): via INTRAVENOUS

## 2012-04-18 MED ORDER — PHENYLEPHRINE 40 MCG/ML (10ML) SYRINGE FOR IV PUSH (FOR BLOOD PRESSURE SUPPORT): 80.0000 ug | PREFILLED_SYRINGE | INTRAVENOUS | Status: DC | PRN

## 2012-04-18 MED ORDER — TERBUTALINE SULFATE 1 MG/ML IJ SOLN: 0.2500 mg | Freq: Once | INTRAMUSCULAR | Status: DC | PRN

## 2012-04-18 MED ORDER — PHENYLEPHRINE 40 MCG/ML (10ML) SYRINGE FOR IV PUSH (FOR BLOOD PRESSURE SUPPORT)
80.0000 ug | PREFILLED_SYRINGE | INTRAVENOUS | Status: DC | PRN
  Filled 2012-04-18: qty 5

## 2012-04-18 MED ORDER — OXYTOCIN 40 UNITS IN LACTATED RINGERS INFUSION - SIMPLE MED
1.0000 m[IU]/min | INTRAVENOUS | Status: DC
  Administered 2012-04-18: 2 m[IU]/min via INTRAVENOUS

## 2012-04-18 MED ORDER — EPHEDRINE 5 MG/ML INJ
10.0000 mg | INTRAVENOUS | Status: DC | PRN
  Filled 2012-04-18: qty 4

## 2012-04-18 MED ORDER — DIPHENHYDRAMINE HCL 50 MG/ML IJ SOLN: 12.5000 mg | INTRAMUSCULAR | Status: DC | PRN

## 2012-04-18 MED ORDER — LIDOCAINE HCL (PF) 1 % IJ SOLN
INTRAMUSCULAR | Status: DC | PRN
  Administered 2012-04-18 (×2): 5 mL

## 2012-04-18 MED ORDER — ACETAMINOPHEN 325 MG PO TABS: 650.0000 mg | ORAL_TABLET | ORAL | Status: DC | PRN

## 2012-04-18 MED ORDER — DEXTROSE 5 % IV SOLN
5.0000 10*6.[IU] | Freq: Once | INTRAVENOUS | Status: AC
  Administered 2012-04-18: 5 10*6.[IU] via INTRAVENOUS
  Filled 2012-04-18: qty 5

## 2012-04-18 MED ORDER — FENTANYL 2.5 MCG/ML BUPIVACAINE 1/10 % EPIDURAL INFUSION (WH - ANES)
14.0000 mL/h | INTRAMUSCULAR | Status: DC
  Administered 2012-04-18: 14 mL/h via EPIDURAL
  Administered 2012-04-18: 12 mL/h via EPIDURAL
  Filled 2012-04-18 (×2): qty 60

## 2012-04-18 MED ORDER — IBUPROFEN 600 MG PO TABS: 600.0000 mg | ORAL_TABLET | Freq: Four times a day (QID) | ORAL | Status: DC | PRN

## 2012-04-18 MED ORDER — OXYTOCIN BOLUS FROM INFUSION
250.0000 mL | Freq: Once | INTRAVENOUS | Status: DC
  Filled 2012-04-18: qty 500

## 2012-04-18 MED ORDER — LACTATED RINGERS IV SOLN: 500.0000 mL | INTRAVENOUS | Status: DC | PRN

## 2012-04-18 MED ORDER — OXYTOCIN 40 UNITS IN LACTATED RINGERS INFUSION - SIMPLE MED
62.5000 mL/h | Freq: Once | INTRAVENOUS | Status: DC
  Filled 2012-04-18: qty 1000

## 2012-04-18 NOTE — Anesthesia Procedure Notes (Signed)
Epidural Patient location during procedure: OB Start time: 04/18/2012 10:43 AM  Staffing Anesthesiologist: Brayton Caves R Performed by: anesthesiologist   Preanesthetic Checklist Completed: patient identified, site marked, surgical consent, pre-op evaluation, timeout performed, IV checked, risks and benefits discussed and monitors and equipment checked  Epidural Patient position: sitting Prep: site prepped and draped and DuraPrep Patient monitoring: continuous pulse ox and blood pressure Approach: midline Injection technique: LOR air and LOR saline  Needle:  Needle type: Tuohy  Needle gauge: 17 G Needle length: 9 cm Needle insertion depth: 5 cm cm Catheter type: closed end flexible Catheter size: 19 Gauge Catheter at skin depth: 10 cm Test dose: negative  Assessment Events: blood not aspirated, injection not painful, no injection resistance, negative IV test and no paresthesia  Additional Notes Patient identified.  Risk benefits discussed including failed block, incomplete pain control, headache, nerve damage, paralysis, blood pressure changes, nausea, vomiting, reactions to medication both toxic or allergic, and postpartum back pain.  Patient expressed understanding and wished to proceed.  All questions were answered.  Sterile technique used throughout procedure and epidural site dressed with sterile barrier dressing. No paresthesia or other complications noted.The patient did not experience any signs of intravascular injection such as tinnitus or metallic taste in mouth nor signs of intrathecal spread such as rapid motor block. Please see nursing notes for vital signs.

## 2012-04-18 NOTE — MAU Note (Signed)
I think I lost my mucous plug about 2345. Call my doctor and told me to take Tylenol and try to rest. Started leaking cl fld at 0100 and have filled 3 pads tonight with clear fld. Ctxs started back about 0545. Decreased fetal movement this morning

## 2012-04-18 NOTE — H&P (Signed)
Pt is a 30 year old white female, G1P0 at 36 weeks who presents to L&D c/o ROM. In the ER pt had +pool and + fern. PNC was complicated by Epilepsy, Crohn's, Anemia, and Rh-. PMHx: Please see [facility] PE: VSSAF         heent- wnl Abd- gravid, non tender, palp contractions  IMP/ IUP at 36 weeks with premature SROM.         Epilepsy         Crohn's         Anemia Plan/ Admit

## 2012-04-18 NOTE — Progress Notes (Signed)
Also having some mucousy pink d/c

## 2012-04-18 NOTE — Anesthesia Preprocedure Evaluation (Signed)
Anesthesia Evaluation  Patient identified by MRN, date of birth, ID band Patient awake    Reviewed: Allergy & Precautions, H&P , Patient's Chart, lab work & pertinent test results  Airway Mallampati: II TM Distance: >3 FB Neck ROM: full    Dental No notable dental hx.    Pulmonary neg pulmonary ROS,  breath sounds clear to auscultation  Pulmonary exam normal       Cardiovascular negative cardio ROS  Rhythm:regular Rate:Normal     Neuro/Psych  Headaches, Seizures -,  negative neurological ROS  negative psych ROS   GI/Hepatic negative GI ROS, Neg liver ROS, GERD-  ,  Endo/Other  negative endocrine ROS  Renal/GU negative Renal ROS     Musculoskeletal   Abdominal   Peds  Hematology negative hematology ROS (+)   Anesthesia Other Findings Epilepsy     Crohn's disease        Anxiety    Reproductive/Obstetrics (+) Pregnancy                           Anesthesia Physical Anesthesia Plan  ASA: III  Anesthesia Plan: Epidural   Post-op Pain Management:    Induction:   Airway Management Planned:   Additional Equipment:   Intra-op Plan:   Post-operative Plan:   Informed Consent: I have reviewed the patients History and Physical, chart, labs and discussed the procedure including the risks, benefits and alternatives for the proposed anesthesia with the patient or authorized representative who has indicated his/her understanding and acceptance.     Plan Discussed with:   Anesthesia Plan Comments:         Anesthesia Quick Evaluation

## 2012-04-18 NOTE — Progress Notes (Signed)
Dr, Dareen Piano at bedside attempted to do SSE. Pt was not able to tolerate and he did not do it and order pt to be admitted to L&D.

## 2012-04-19 ENCOUNTER — Encounter (HOSPITAL_COMMUNITY): Payer: Self-pay | Admitting: *Deleted

## 2012-04-19 LAB — ABO/RH: ABO/RH(D): A NEG

## 2012-04-19 MED ORDER — SENNOSIDES-DOCUSATE SODIUM 8.6-50 MG PO TABS
2.0000 | ORAL_TABLET | Freq: Every day | ORAL | Status: DC
  Administered 2012-04-19: 2 via ORAL

## 2012-04-19 MED ORDER — IBUPROFEN 600 MG PO TABS
600.0000 mg | ORAL_TABLET | Freq: Four times a day (QID) | ORAL | Status: DC
  Administered 2012-04-19 – 2012-04-20 (×6): 600 mg via ORAL
  Filled 2012-04-19 (×6): qty 1

## 2012-04-19 MED ORDER — DIPHENHYDRAMINE HCL 25 MG PO CAPS: 25.0000 mg | ORAL_CAPSULE | Freq: Four times a day (QID) | ORAL | Status: DC | PRN

## 2012-04-19 MED ORDER — OXYCODONE-ACETAMINOPHEN 5-325 MG PO TABS
1.0000 | ORAL_TABLET | ORAL | Status: DC | PRN
  Administered 2012-04-19 – 2012-04-20 (×3): 1 via ORAL
  Filled 2012-04-19 (×3): qty 1

## 2012-04-19 MED ORDER — PRENATAL MULTIVITAMIN CH
1.0000 | ORAL_TABLET | Freq: Every day | ORAL | Status: DC
  Filled 2012-04-19 (×2): qty 1

## 2012-04-19 MED ORDER — SIMETHICONE 80 MG PO CHEW: 80.0000 mg | CHEWABLE_TABLET | ORAL | Status: DC | PRN

## 2012-04-19 MED ORDER — ONDANSETRON HCL 4 MG PO TABS: 4.0000 mg | ORAL_TABLET | ORAL | Status: DC | PRN

## 2012-04-19 MED ORDER — ONDANSETRON HCL 4 MG/2ML IJ SOLN: 4.0000 mg | INTRAMUSCULAR | Status: DC | PRN

## 2012-04-19 MED ORDER — BENZOCAINE-MENTHOL 20-0.5 % EX AERO
INHALATION_SPRAY | CUTANEOUS | Status: AC
  Filled 2012-04-19: qty 56

## 2012-04-19 MED ORDER — TETANUS-DIPHTH-ACELL PERTUSSIS 5-2.5-18.5 LF-MCG/0.5 IM SUSP: 0.5000 mL | Freq: Once | INTRAMUSCULAR | Status: DC

## 2012-04-19 NOTE — Progress Notes (Signed)
Patient is eating, ambulating, voiding.  Pain control is good.  Appropriate lochia.  No complaints.  Filed Vitals:   04/18/12 1601 04/18/12 1630 04/19/12 0245 04/19/12 0635  BP: 113/83 112/76 110/73 109/73  Pulse: 104 84 81 85  Temp: 98.8 F (37.1 C)  98.1 F (36.7 C) 98 F (36.7 C)  TempSrc: Oral  Oral Oral  Resp: 18 18 18 18   Height:      Weight:      SpO2:   96% 97%    Fundus firm No CT  Lab Results  Component Value Date   WBC 10.4 04/18/2012   HGB 12.6 04/18/2012   HCT 38.3 04/18/2012   MCV 90.5 04/18/2012   PLT 249 04/18/2012    --/--/A NEG (07/21 0950)/RI  A/P Post partum day 1.  Routine care.  Expect d/c 7/23.   Consented for circ.  Given pt's prior meds for epilepsy and crohn's peds recommends further eval of baby prior to circ.  Spoke with peds, will have me called when baby is cleared.  Allyn Kenner

## 2012-04-19 NOTE — Anesthesia Postprocedure Evaluation (Signed)
  Anesthesia Post-op Note  Patient: Desiree Marshall  Procedure(s) Performed: * No surgery found *  Patient Location: Mother/Baby  Anesthesia Type: Epidural  Level of Consciousness: awake  Airway and Oxygen Therapy: Patient Spontanous Breathing  Post-op Pain: none  Post-op Assessment: Patient's Cardiovascular Status Stable, Respiratory Function Stable, Patent Airway, No signs of Nausea or vomiting, Adequate PO intake, Pain level controlled, No headache, No backache, No residual numbness and No residual motor weakness  Post-op Vital Signs: Reviewed and stable  Complications: No apparent anesthesia complications

## 2012-04-20 ENCOUNTER — Encounter (HOSPITAL_COMMUNITY): Payer: Self-pay | Admitting: *Deleted

## 2012-04-20 LAB — COMPREHENSIVE METABOLIC PANEL
AST: 27 U/L (ref 0–37)
CO2: 24 mEq/L (ref 19–32)
Calcium: 8.3 mg/dL — ABNORMAL LOW (ref 8.4–10.5)
Creatinine, Ser: 0.66 mg/dL (ref 0.50–1.10)
GFR calc non Af Amer: >90 mL/min (ref >90)

## 2012-04-20 LAB — CBC
Hemoglobin: 9.4 g/dL — ABNORMAL LOW (ref 12.0–15.0)
MCH: 29.8 pg (ref 26.0–34.0)
RBC: 3.15 MIL/uL — ABNORMAL LOW (ref 3.87–5.11)
WBC: 15.5 10*3/uL — ABNORMAL HIGH (ref 4.0–10.5)

## 2012-04-20 NOTE — Downtime Event Note (Signed)
The EMR was down for 1630-0230 hours on 04/18/12-04/19/12.  Dorene Grebe Deal and Welford Roche was responsible for completing the paper charting during this time period.   The following information was re-entered into the system by Dionicia Abler: Height/weight, Allergies, Problem list, Home meds, Flowsheet data, Intake and output, Orders and MAR  The following information will remain in the paper chart: delivery record  Dionicia Abler 04/20/2012

## 2012-04-20 NOTE — Progress Notes (Signed)
Dr. Arlyce Dice notified of the Lamotrigine level that was drawn with the 5am lab draw from another tube of blood this am. It was not drawn at 1200 pm. I entered a new order for the Lamotrigine level to be drawn at 1550 per Dr. Arlyce Dice. He is aware.

## 2012-04-20 NOTE — Discharge Summary (Signed)
Obstetric Discharge Summary Reason for Admission: onset of labor, PROM Prenatal Procedures: ultrasound Intrapartum Procedures: forceps  Postpartum Procedures: none Complications-Operative and Postpartum: 3rd degree perineal laceration and vaginal laceration Hemoglobin  Date Value Range Status  04/20/2012 9.4* 12.0 - 15.0 g/dL Final     DELTA CHECK NOTED     REPEATED TO VERIFY     HCT  Date Value Range Status  04/20/2012 28.7* 36.0 - 46.0 % Final    Physical Exam:  General: alert Lochia: appropriate Uterine Fundus: firm  Discharge Diagnoses: Late preterm delivery, PROM  Discharge Information: Date: 04/20/2012 Activity: pelvic rest Diet: routine Medications: PNV, Ibuprofen and resume preadmission medications Condition: stable Instructions: refer to practice specific booklet Discharge to: Rooming in with baby   Newborn Data: Live born female  Birth Weight: 5 lb 6.6 oz (2455 g) APGAR: 8, 9  Observation for 24 hours with Mother.  Margarette Vannatter D 04/20/2012, 3:02 PM

## 2012-04-21 LAB — LAMOTRIGINE LEVEL: Lamotrigine Lvl: 5.2 ug/mL (ref 3.0–14.0)

## 2013-05-11 ENCOUNTER — Other Ambulatory Visit: Payer: Self-pay | Admitting: Obstetrics and Gynecology

## 2013-06-16 ENCOUNTER — Ambulatory Visit (INDEPENDENT_AMBULATORY_CARE_PROVIDER_SITE_OTHER): Payer: 59 | Admitting: Family Medicine

## 2013-06-16 ENCOUNTER — Encounter: Payer: Self-pay | Admitting: Family Medicine

## 2013-06-16 VITALS — BP 112/78 | HR 82 | Resp 16 | Ht 59.0 in | Wt 123.0 lb

## 2013-06-16 DIAGNOSIS — K5090 Crohn's disease, unspecified, without complications: Secondary | ICD-10-CM

## 2013-06-16 DIAGNOSIS — Z3169 Encounter for other general counseling and advice on procreation: Secondary | ICD-10-CM

## 2013-06-16 DIAGNOSIS — R569 Unspecified convulsions: Secondary | ICD-10-CM

## 2013-06-16 DIAGNOSIS — Z23 Encounter for immunization: Secondary | ICD-10-CM

## 2013-06-16 NOTE — Progress Notes (Signed)
  Subjective:    Patient ID: Desiree Marshall, female    DOB: 1981-03-22, 32 y.o.   MRN: [id number]  HPI  Desiree Marshall is here today to establish care with our practice.  Her friend Alba Cory) referred her to our practice. She used to received care at [facility] but her PCP is no longer practicing medicine.  She would like to have a CPE in the future and ensure that she is healthy enough for a second pregnancy.     Review of Systems  Constitutional: Negative.   HENT: Negative.   Eyes: Negative.   Respiratory: Negative.   Cardiovascular: Negative.   Gastrointestinal: Negative.   Endocrine: Negative.   Genitourinary: Negative.   Musculoskeletal: Negative.   Skin: Negative.   Allergic/Immunologic: Negative.   Neurological: Negative.   Hematological: Negative.   Psychiatric/Behavioral: Negative.     Past Medical History  Diagnosis Date  . Epilepsy   . Crohn's disease   . Anemia     Resolved  . Astigmatism     Family History  Problem Relation Age of Onset  . Asthma Mother   . Hypertension Mother   . Hypertension Father   . Cancer Father 73    Prostate Cancer  . Heart disease Maternal Grandmother   . Heart disease Maternal Grandfather   . Stroke Paternal Grandmother   . Heart disease Paternal Grandfather     History   Social History Narrative   Marital Status:  Married Estate agent)    Children:  Son Corporate treasurer)    Pets: Dog (1)    Living Situation: Lives with husband and son   Occupation: Futures trader    Education: Manufacturing engineer in Nursing Administration       Tobacco Use/Exposure:  None    Alcohol Use:  Occasional   Drug Use:  None   Diet:  Regular   Exercise:  Walking - once weekly    Hobbies: Baking - Sewing                   Objective:   Physical Exam  Vitals reviewed. Constitutional: She is oriented to person, place, and time.  Eyes: Conjunctivae are normal. No scleral icterus.  Neck: Neck supple. No thyromegaly present.  Cardiovascular: Normal rate,  regular rhythm and normal heart sounds.   Pulmonary/Chest: Effort normal and breath sounds normal.  Musculoskeletal: She exhibits no edema and no tenderness.  Lymphadenopathy:    She has no cervical adenopathy.  Neurological: She is alert and oriented to person, place, and time.  Skin: Skin is warm and dry.  Psychiatric: She has a normal mood and affect. Her behavior is normal. Judgment and thought content normal.     Assessment & Plan:

## 2013-06-22 ENCOUNTER — Ambulatory Visit (INDEPENDENT_AMBULATORY_CARE_PROVIDER_SITE_OTHER): Payer: 59 | Admitting: Nurse Practitioner

## 2013-06-22 ENCOUNTER — Encounter: Payer: Self-pay | Admitting: Nurse Practitioner

## 2013-06-22 VITALS — BP 105/66 | HR 83 | Ht 59.0 in | Wt 126.0 lb

## 2013-06-22 DIAGNOSIS — R51 Headache: Secondary | ICD-10-CM

## 2013-06-22 DIAGNOSIS — G40309 Generalized idiopathic epilepsy and epileptic syndromes, not intractable, without status epilepticus: Secondary | ICD-10-CM

## 2013-06-22 DIAGNOSIS — G40209 Localization-related (focal) (partial) symptomatic epilepsy and epileptic syndromes with complex partial seizures, not intractable, without status epilepticus: Secondary | ICD-10-CM

## 2013-06-22 DIAGNOSIS — R5381 Other malaise: Secondary | ICD-10-CM | POA: Insufficient documentation

## 2013-06-22 DIAGNOSIS — Z79899 Other long term (current) drug therapy: Secondary | ICD-10-CM | POA: Insufficient documentation

## 2013-06-22 NOTE — Patient Instructions (Addendum)
Will check Lamictal level today as well as other labs requested by primary care and M.D. at [facility] Continue Lamictal as ordered Followup in 6 months

## 2013-06-22 NOTE — Progress Notes (Signed)
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 Her seizures have been well controlled. Her last seizure occurred in 05-Apr-2008 after her grandmother died and she missed several doses of medication, prior to that her last seizure was 2000. She also has a history of Crohn's disease. She was able to be successfully switched from carbamazepine to lamotrigine in 04/05/09. She delivered Desiree Marshall on 04/18/12. Lamictal dose back at 217m XR. She reports occasional dizziness.  Desiree Marshall also has a history of Crohn's disease and [facility]     REVIEW OF SYSTEMS: Full 14 system review of systems performed and notable only for:  Constitutional: Fatigue Cardiovascular: N/A  Ear/Nose/Throat: N/A  Skin: N/A  Eyes: N/A  Respiratory: N/A  Gastroitestinal: N/A  Hematology/Lymphatic: N/A  Endocrine: N/A Musculoskeletal joint pain  Allergy/Immunology: N/A  Neurological: N/A Psychiatric: N/A   ALLERGIES: Allergies  Allergen Reactions  . Promethazine Hcl Other (See Comments)    hallucinations    HOME MEDICATIONS: Outpatient Prescriptions Prior to Visit  Medication Sig Dispense Refill  . adalimumab (HUMIRA) 40 MG/0.8ML injection Inject 40 mg into the skin once a week.       . azaTHIOprine (IMURAN) 50 MG tablet Take 50 mg by mouth daily.      . cholecalciferol (VITAMIN D) 1000 UNITS tablet Take 1,000 Units by mouth daily. Taking 2 pills daily      . folic acid (FOLVITE) 4756MCG tablet Take 400 mcg by mouth daily. Takes 8 pills daily.      . LamoTRIgine (LAMICTAL XR) 200 MG TB24 Take 1 tablet by mouth daily. Patient takes a total of 2514mof Lamictal      . Prenatal Vit-Fe Fumarate-FA (PRENATAL MULTIVITAMIN) TABS Take 1 tablet by mouth daily.       No facility-administered medications prior to visit.     PAST MEDICAL HISTORY: Past Medical History  Diagnosis Date  . Crohn's disease   . Anemia     Resolved  . Astigmatism   . Epilepsy     Diagnosed at the age of 32    PAST SURGICAL HISTORY: Past Surgical History  Procedure Laterality Date  . No past surgeries      FAMILY HISTORY: Family History  Problem Relation Age of Onset  . Asthma Mother   . Hypertension Mother   . Hypertension Father   . Cancer Father [id number]  Prostate Cancer  . Heart disease Maternal Grandmother   . Heart disease Maternal Grandfather   . Stroke Paternal Grandmother   . Heart disease Paternal Grandfather     SOCIAL HISTORY: History   Social History  . Marital Status: Married    Spouse Name: Desiree Marshall   Number of Children: 1  . Years of Education: 0   Occupational History  . HOMEMAKER     Social History Main Topics  . Smoking status: Never Smoker   . Smokeless tobacco: Never Used  . Alcohol Use: No  . Drug Use: No  . Sexual Activity: Yes    Partners: Male    Birth Control/ Protection: None   Other Topics Concern  . Not on file   Social History Narrative   Marital Status:  Married (CTherapist, art   Children:  Son (CDerald Marshall   Pets:  Dog (1)    Living Situation: Lives with husband and son   Occupation: Agricultural engineer    Education: Conservator, museum/gallery in [place] Use/Exposure:  None    Alcohol Use:  Occasional   Drug Use:  None   Diet:  Regular   Exercise:  Walking - once weekly    Hobbies: Baking - Sewing                  PHYSICAL EXAM  Filed Vitals:   06/22/13 0853  BP: 105/66  Pulse: 83  Height: 4' 11"  (1.499 m)  Weight: 126 lb (57.153 kg)   Body mass index is 25.44 kg/(m^2).  Generalized: Well developed, in no acute distress  Head: normocephalic and atraumatic,. Oropharynx benign  Neck: Supple, no carotid bruits  Cardiac: Regular rate rhythm, no murmur  Musculoskeletal: No deformity   Neurological examination   Mentation: Alert oriented to  time, place, history taking. Follows all commands speech and language fluent  Cranial nerve II-XII: Pupils were equal round reactive to light extraocular movements were full, visual field were full on confrontational test. Facial sensation and strength were normal. hearing was intact to finger rubbing bilaterally. Uvula tongue midline. head turning and shoulder shrug and were normal and symmetric.Tongue protrusion into cheek strength was normal. Motor: normal bulk and tone, full strength in the BUE, BLE, fine finger movements normal, no pronator drift. No focal weakness Sensory: normal and symmetric to light touch, pinprick, and  vibration  Coordination: finger-nose-finger, heel-to-shin bilaterally, no dysmetria Reflexes: Brachioradialis 2/2, biceps 2/2, triceps 2/2, patellar 2/2, Achilles 2/2, plantar responses were flexor bilaterally. Gait and Station: Rising up from seated position without assistance, normal stance, without trunk ataxia, moderate stride, good arm swing, smooth turning, able to perform tiptoe, and heel walking without difficulty.   DIAGNOSTIC DATA (LABS, IMAGING, TESTING) None to review   ASSESSMENT AND PLAN  32 y.o. year old female  has a past medical history of Crohn's disease; Anemia; Astigmatism; and Epilepsy here in followup. Patient is wishing to get pregnant again. She did well with her last pregnancy on Lamictal. She realizes she is high risk due to her seizure disorder. Will check Lamictal level today as well as other labs requested by primary care and M.D. at [facility] as ordered Followup in 6 months Dennie Bible, [facility], [facility], [facility] [address] [place], [place] [address] [phone]

## 2013-06-24 LAB — CBC WITH DIFFERENTIAL/PLATELET
Basos: 2 %
Eos: 5 %
Hemoglobin: 12.9 g/dL (ref 11.1–15.9)
Immature Grans (Abs): 0 10*3/uL (ref 0.0–0.1)
Lymphs: 26 %
Monocytes: 11 %
Neutrophils Absolute: 2.9 10*3/uL (ref 1.4–7.0)
RBC: 4.38 x10E6/uL (ref 3.77–5.28)
WBC: 5.3 10*3/uL (ref 3.4–10.8)

## 2013-06-24 LAB — COMPREHENSIVE METABOLIC PANEL
Albumin/Globulin Ratio: 1.8 (ref 1.1–2.5)
Albumin: 4.5 g/dL (ref 3.5–5.5)
BUN: 10 mg/dL (ref 6–20)
Calcium: 9.4 mg/dL (ref 8.7–10.2)
GFR calc Af Amer: 137 mL/min/{1.73_m2} (ref >59)
GFR calc non Af Amer: 119 mL/min/{1.73_m2} (ref >59)
Glucose: 86 mg/dL (ref 65–99)
Total Bilirubin: 0.3 mg/dL (ref 0.0–1.2)
Total Protein: 7 g/dL (ref 6.0–8.5)

## 2013-06-24 LAB — GAMMA GT: GGT: 11 IU/L (ref 0–60)

## 2013-06-24 LAB — FOLATE: Folate: >19.9 ng/mL (ref >3.0)

## 2013-06-24 LAB — LAMOTRIGINE LEVEL: Lamotrigine Lvl: 5.3 ug/mL (ref 2.0–20.0)

## 2013-06-24 NOTE — Progress Notes (Signed)
Quick Note:  Results given. Pt verbalized understanding. ______

## 2013-07-17 ENCOUNTER — Other Ambulatory Visit: Payer: Self-pay | Admitting: Neurology

## 2013-07-28 ENCOUNTER — Encounter: Payer: 59 | Admitting: Family Medicine

## 2013-08-03 ENCOUNTER — Encounter: Payer: Self-pay | Admitting: Family Medicine

## 2013-08-03 ENCOUNTER — Ambulatory Visit (INDEPENDENT_AMBULATORY_CARE_PROVIDER_SITE_OTHER): Payer: 59 | Admitting: Family Medicine

## 2013-08-03 VITALS — BP 105/75 | HR 89 | Resp 16 | Ht 59.0 in | Wt 125.0 lb

## 2013-08-03 DIAGNOSIS — Z0000 Encounter for general adult medical examination without abnormal findings: Secondary | ICD-10-CM

## 2013-08-03 DIAGNOSIS — L309 Dermatitis, unspecified: Secondary | ICD-10-CM

## 2013-08-03 DIAGNOSIS — I781 Nevus, non-neoplastic: Secondary | ICD-10-CM

## 2013-08-03 DIAGNOSIS — Z23 Encounter for immunization: Secondary | ICD-10-CM | POA: Insufficient documentation

## 2013-08-03 DIAGNOSIS — Z3169 Encounter for other general counseling and advice on procreation: Secondary | ICD-10-CM | POA: Insufficient documentation

## 2013-08-03 DIAGNOSIS — L259 Unspecified contact dermatitis, unspecified cause: Secondary | ICD-10-CM

## 2013-08-03 LAB — POCT URINALYSIS DIPSTICK
Bilirubin, UA: NEGATIVE
Blood, UA: NEGATIVE
Glucose, UA: NEGATIVE
Ketones, UA: NEGATIVE
Leukocytes, UA: NEGATIVE
Nitrite, UA: NEGATIVE
Protein, UA: NEGATIVE
Spec Grav, UA: 1.02
Urobilinogen, UA: NEGATIVE
pH, UA: 7.5

## 2013-08-03 MED ORDER — TRIAMCINOLONE ACETONIDE 0.1 % EX OINT: 1.0000 "application " | TOPICAL_OINTMENT | Freq: Three times a day (TID) | CUTANEOUS | Status: DC

## 2013-08-03 NOTE — Progress Notes (Signed)
  Subjective:    Patient ID: Desiree Marshall, female    DOB: April 22, 1981, 32 y.o.   MRN: [id number]  HPI  Desiree Marshall is here today for her annual CPE.  She gets her pap smear at her OBGYN's office.  She has done well since her last office visit.    Review of Systems  Constitutional: Negative.   HENT: Negative.   Eyes: Negative.   Respiratory: Negative.   Cardiovascular: Negative.   Gastrointestinal: Negative.   Endocrine: Negative.   Genitourinary: Negative.   Musculoskeletal: Negative.   Skin: Negative.   Allergic/Immunologic: Negative.   Neurological: Negative.   Hematological: Negative.   Psychiatric/Behavioral: Negative.      Past Medical History  Diagnosis Date  . Crohn's disease   . Astigmatism   . Anemia     Resolved  . Epilepsy     Diagnosed at the age of 59.      Family History  Problem Relation Age of Onset  . Asthma Mother   . Hypertension Mother   . Hypertension Father   . Cancer Father 77    Prostate Cancer  . Heart disease Maternal Grandmother   . Heart disease Maternal Grandfather   . Stroke Paternal Grandmother   . Heart disease Paternal Grandfather      History   Social History Narrative   Marital Status:  Married Therapist, art)    Children:  Son Banker)    Pets: Dog (1)    Living Situation: Lives with husband and son   Occupation: Agricultural engineer    Education: Conservator, museum/gallery in [place] Use/Exposure:  None    Alcohol Use:  Occasional   Drug Use:  None   Diet:  Regular   Exercise:  Walking - once weekly    Hobbies: Baking - Sewing                    Objective:   Physical Exam  Vitals reviewed. Constitutional: She is oriented to person, place, and time. She appears well-developed and well-nourished.  Cardiovascular: Normal rate and regular rhythm.   Pulmonary/Chest: Effort normal and breath sounds normal. Right breast exhibits no inverted nipple, no mass, no nipple discharge, no skin change and no tenderness. Left  breast exhibits no inverted nipple, no mass, no nipple discharge, no skin change and no tenderness. Breasts are symmetrical.  Musculoskeletal: Normal range of motion.  Neurological: She is alert and oriented to person, place, and time.  Skin: Skin is warm and dry.     Psychiatric: She has a normal mood and affect.      Assessment & Plan:

## 2013-08-03 NOTE — Assessment & Plan Note (Signed)
The patient confirmed that they are not allergic to eggs and have never had a bad reaction with the flu shot in the past.  The vaccination was given without difficulty.   

## 2013-08-03 NOTE — Patient Instructions (Signed)
1)  Cracked Heels (Coloplast Atrac - Tain)  2)  Fingers (Triamcinolone Ointment) +/- Eucerin Cream   3)  Bile Acid ? - Metamucil vs Questran to help you excrete bile acids   4)  Mole - Consider removal of the mole on left breast.    Eczema Atopic dermatitis, or eczema, is an inherited type of sensitive skin. Often people with eczema have a family history of allergies, asthma, or hay fever. It causes a red itchy rash and dry scaly skin. The itchiness may occur before the skin rash and may be very intense. It is not contagious. Eczema is generally worse during the cooler winter months and often improves with the warmth of summer. Eczema usually starts showing signs in infancy. Some children outgrow eczema, but it may last through adulthood. Flare-ups may be caused by:  Eating something or contact with something you are sensitive or allergic to.  Stress. DIAGNOSIS  The diagnosis of eczema is usually based upon symptoms and medical history. TREATMENT  Eczema cannot be cured, but symptoms usually can be controlled with treatment or avoidance of allergens (things to which you are sensitive or allergic to).  Controlling the itching and scratching.  Use over-the-counter antihistamines as directed for itching. It is especially useful at night when the itching tends to be worse.  Use over-the-counter steroid creams as directed for itching.  Scratching makes the rash and itching worse and may cause impetigo (a skin infection) if fingernails are contaminated (dirty).  Keeping the skin well moisturized with creams every day. This will seal in moisture and help prevent dryness. Lotions containing alcohol and water can dry the skin and are not recommended.  Limiting exposure to allergens.  Recognizing situations that cause stress.  Developing a plan to manage stress. HOME CARE INSTRUCTIONS   Take prescription and over-the-counter medicines as directed by your caregiver.  Do not use anything on  the skin without checking with your caregiver.  Keep baths or showers short (5 minutes) in warm (not hot) water. Use mild cleansers for bathing. You may add non-perfumed bath oil to the bath water. It is best to avoid soap and bubble bath.  Immediately after a bath or shower, when the skin is still damp, apply a moisturizing ointment to the entire body. This ointment should be a petroleum ointment. This will seal in moisture and help prevent dryness. The thicker the ointment the better. These should be unscented.  Keep fingernails cut short and wash hands often. If your child has eczema, it may be necessary to put soft gloves or mittens on your child at night.  Dress in clothes made of cotton or cotton blends. Dress lightly, as heat increases itching.  Avoid foods that may cause flare-ups. Common foods include cow's milk, peanut butter, eggs and wheat.  Keep a child with eczema away from anyone with fever blisters. The virus that causes fever blisters (herpes simplex) can cause a serious skin infection in children with eczema. SEEK MEDICAL CARE IF:   Itching interferes with sleep.  The rash gets worse or is not better within one week following treatment.  The rash looks infected (pus or soft yellow scabs).  You or your child has an oral temperature above 102 F (38.9 C).  Your baby is older than 3 months with a rectal temperature of 100.5 F (38.1 C) or higher for more than 1 day.  The rash flares up after contact with someone who has fever blisters. SEEK IMMEDIATE MEDICAL CARE IF:  Your baby is older than 3 months with a rectal temperature of 102 F (38.9 C) or higher.  Your baby is older than 3 months or younger with a rectal temperature of 100.4 F (38 C) or higher. Document Released: 09/12/2000 Document Revised: 12/08/2011 Document Reviewed: 04/18/2013 [employer] Patient Information 2014 [place].

## 2013-08-03 NOTE — Assessment & Plan Note (Signed)
She is managed by [facility]. Her disease appears to be under good control.

## 2013-08-03 NOTE — Assessment & Plan Note (Addendum)
She is stable on Lamictal. She is managed by a neurologist.

## 2013-08-03 NOTE — Assessment & Plan Note (Signed)
I recommended that she consult her neurologist, gastroenterologist and an OBGYN specializing in high-risk pregnancy before conception.

## 2013-10-04 DIAGNOSIS — Z0000 Encounter for general adult medical examination without abnormal findings: Secondary | ICD-10-CM | POA: Insufficient documentation

## 2013-10-15 DIAGNOSIS — L309 Dermatitis, unspecified: Secondary | ICD-10-CM | POA: Insufficient documentation

## 2013-10-27 ENCOUNTER — Telehealth: Payer: Self-pay | Admitting: Neurology

## 2013-10-27 NOTE — Telephone Encounter (Signed)
Informed patient per Ms Martin's note, she verbalized understanding

## 2013-10-27 NOTE — Telephone Encounter (Signed)
Not necessary can do that day

## 2013-10-27 NOTE — Telephone Encounter (Signed)
Patient wants to know if she should just come in for her end of trimester(02/18) labs or schedule an appt.?

## 2013-10-27 NOTE — Telephone Encounter (Signed)
Pt needs appt

## 2013-10-27 NOTE — Telephone Encounter (Signed)
Patient called to state that she is pregnant and Hoyle Sauer wants to see her at the end of every trimester. Patient states the end of her first trimester is February 18. Please call patient back.

## 2013-10-27 NOTE — Telephone Encounter (Signed)
Patient would like to know should she have labs prior to her office visit on 03/02

## 2013-11-04 DIAGNOSIS — Z0289 Encounter for other administrative examinations: Secondary | ICD-10-CM

## 2013-11-13 LAB — OB RESULTS CONSOLE GC/CHLAMYDIA
CHLAMYDIA, DNA PROBE: NEGATIVE
GC PROBE AMP, GENITAL: NEGATIVE

## 2013-11-13 LAB — OB RESULTS CONSOLE HIV ANTIBODY (ROUTINE TESTING): HIV: NONREACTIVE

## 2013-11-13 LAB — OB RESULTS CONSOLE RPR: RPR: NONREACTIVE

## 2013-11-22 ENCOUNTER — Telehealth: Payer: Self-pay | Admitting: Neurology

## 2013-11-22 NOTE — Telephone Encounter (Signed)
Patient calling about the status of her paperwork for the [facility], checking to see if it has been sent yet. Please call patient and advise.

## 2013-11-22 NOTE — Telephone Encounter (Signed)
I called pt and let her know that from clinic side, is done and signed, due to weather MR not available to ask.

## 2013-11-22 NOTE — Telephone Encounter (Signed)
Call patient to inform her form fax on 11/15/2013.

## 2013-11-28 ENCOUNTER — Ambulatory Visit: Payer: Self-pay | Admitting: Nurse Practitioner

## 2013-11-28 ENCOUNTER — Telehealth: Payer: Self-pay | Admitting: Nurse Practitioner

## 2013-11-28 DIAGNOSIS — G40909 Epilepsy, unspecified, not intractable, without status epilepticus: Secondary | ICD-10-CM

## 2013-11-28 NOTE — Telephone Encounter (Signed)
Chart reviewed, the patient has a history of epilepsy, taking Lamictal xr 200 mg once every day, I have put orders in for Lamictal level, please advise her to come in in the evening time, before next dose of Lamictal, for trough  level

## 2013-11-28 NOTE — Telephone Encounter (Signed)
Called pt to let her know NP/CM is out sick and had to r/s pt has an apt 12/22/13 but advised me that she feels like she needs to have a blood test done due to her pregnancy. Please call pt to confirm about her blood work.

## 2013-11-28 NOTE — Telephone Encounter (Signed)
Spoke with patient and she would like to know if she should  have her lamicital levels drawn,is going to her OB-GYN before her next visit with Ms Daphine Deutscher. Can she  have it done there if needed?

## 2013-11-29 ENCOUNTER — Other Ambulatory Visit (INDEPENDENT_AMBULATORY_CARE_PROVIDER_SITE_OTHER): Payer: Self-pay

## 2013-11-29 DIAGNOSIS — G40909 Epilepsy, unspecified, not intractable, without status epilepticus: Secondary | ICD-10-CM

## 2013-11-29 DIAGNOSIS — Z0289 Encounter for other administrative examinations: Secondary | ICD-10-CM

## 2013-11-29 NOTE — Telephone Encounter (Signed)
Patient is going to her OB doctor on Thursday and is going to get her Lamictal level done there, when she has other labs drawn.

## 2013-12-01 LAB — LAMOTRIGINE LEVEL: Lamotrigine Lvl: 1.2 ug/mL — ABNORMAL LOW (ref 2.0–20.0)

## 2013-12-02 ENCOUNTER — Telehealth: Payer: Self-pay | Admitting: Neurology

## 2013-12-02 DIAGNOSIS — G40909 Epilepsy, unspecified, not intractable, without status epilepticus: Secondary | ICD-10-CM

## 2013-12-02 MED ORDER — LAMOTRIGINE ER 50 MG PO TB24: 50.0000 mg | ORAL_TABLET | Freq: Every day | ORAL | Status: DC

## 2013-12-02 NOTE — Telephone Encounter (Signed)
Called patient increase Lamictal to 254m . Will call in extra 534mdose of Lamictal. Repeat level in 10 days

## 2013-12-02 NOTE — Telephone Encounter (Signed)
Carolyn:  Her lamotrigine level is low 1.2, please aware and call patient.

## 2013-12-13 ENCOUNTER — Other Ambulatory Visit (INDEPENDENT_AMBULATORY_CARE_PROVIDER_SITE_OTHER): Payer: Self-pay

## 2013-12-13 DIAGNOSIS — Z0289 Encounter for other administrative examinations: Secondary | ICD-10-CM

## 2013-12-13 DIAGNOSIS — G40909 Epilepsy, unspecified, not intractable, without status epilepticus: Secondary | ICD-10-CM

## 2013-12-15 LAB — LAMOTRIGINE LEVEL: LAMOTRIGINE LVL: 4.4 ug/mL (ref 2.0–20.0)

## 2013-12-22 ENCOUNTER — Telehealth: Payer: Self-pay | Admitting: Neurology

## 2013-12-22 ENCOUNTER — Ambulatory Visit (INDEPENDENT_AMBULATORY_CARE_PROVIDER_SITE_OTHER): Payer: 59 | Admitting: Nurse Practitioner

## 2013-12-22 ENCOUNTER — Encounter: Payer: Self-pay | Admitting: Nurse Practitioner

## 2013-12-22 VITALS — BP 105/63 | HR 90 | Ht 59.02 in | Wt 141.0 lb

## 2013-12-22 DIAGNOSIS — G40309 Generalized idiopathic epilepsy and epileptic syndromes, not intractable, without status epilepticus: Secondary | ICD-10-CM

## 2013-12-22 DIAGNOSIS — G40209 Localization-related (focal) (partial) symptomatic epilepsy and epileptic syndromes with complex partial seizures, not intractable, without status epilepticus: Secondary | ICD-10-CM

## 2013-12-22 MED ORDER — LAMOTRIGINE ER 50 MG PO TB24: 50.0000 mg | ORAL_TABLET | Freq: Every day | ORAL | Status: DC

## 2013-12-22 MED ORDER — LAMICTAL XR 50 MG PO TB24: 50.0000 mg | ORAL_TABLET | Freq: Every day | ORAL | Status: DC

## 2013-12-22 NOTE — Telephone Encounter (Signed)
Patient at check out today states that on her AVS sheet she has Lamotrigine generic but states that she needs it to be brand name. Please call and advise patient.

## 2013-12-22 NOTE — Progress Notes (Signed)
[facility]  PATIENT: Desiree Marshall DOB: 1981/09/04   REASON FOR VISIT: Followup for seizure disorder   HISTORY OF PRESENT ILLNESS:Ms Kosloski, 33 year old female returns for followup. She has a history of seizure disorder with last seizure occurring in 2009. She is currently [redacted] weeks pregnant and had  recent lamotrigine level which was 1.2. Her medication was increased to 250 XR  at that time and her level came up to 4.4. She returns today for followup. So far she has had no problems with her pregnancy. She also has a history of Crohn's disease and is followed at [facility]. She returns for reevaluation  HISTORY: of seizures since she was a teenager. Her seizures have been well controlled. Her last seizure occurred in 2008/04/07 after her grandmother died and she missed several doses of medication, prior to that her last seizure was 2000. She also has a history of Crohn's disease. She was able to be successfully switched from carbamazepine to lamotrigine in 04-07-2009. She delivered baby boy on 04/18/12. Lamictal dose back at 269m XR. She reports occasional dizziness. He also has a history of Crohn's disease and [facility]      REVIEW OF SYSTEMS: Full 14 system review of systems performed and notable only for those listed, all others are neg:  Constitutional: N/A  Cardiovascular: N/A  Ear/Nose/Throat: N/A  Skin: N/A  Eyes: N/A  Respiratory: N/A  Gastroitestinal: Urinary frequency  Hematology/Lymphatic: N/A  Endocrine: N/A Musculoskeletal: Achy muscles, joint pain  Allergy/Immunology: N/A  Neurological: Occasional headache  Psychiatric: N/A   ALLERGIES: Allergies  Allergen Reactions  . Promethazine Hcl Other (See Comments)    hallucinations    HOME MEDICATIONS: Outpatient Prescriptions Prior to Visit  Medication Sig Dispense Refill  . adalimumab (HUMIRA) 40 MG/0.8ML injection Inject 40 mg into the skin once a week.       . azaTHIOprine (IMURAN) 50 MG  tablet Take 50 mg by mouth daily.      . cholecalciferol (VITAMIN D) 1000 UNITS tablet Take 1,000 Units by mouth daily. Taking 2 pills daily      . folic acid (FOLVITE) 4073MCG tablet Take 400 mcg by mouth daily. Takes 8 pills daily.      .Marland KitchenLAMICTAL XR 200 MG TB24 TAKE 1 TABLET DAILY  90 tablet  1  . LamoTRIgine 50 MG TB24 Take 1 tablet (50 mg total) by mouth daily.  30 tablet  6  . Prenat w/o A-FeCbn-Meth-FA-DHA (PRENATE MINI) 29-0.6-0.4-350 MG CAPS       . triamcinolone ointment (KENALOG) 0.1 % Apply 1 application topically 3 (three) times daily.  454 g  2   No facility-administered medications prior to visit.    PAST MEDICAL HISTORY: Past Medical History  Diagnosis Date  . Crohn's disease   . Astigmatism   . Anemia     Resolved  . Epilepsy     Diagnosed at the age of 89     PAST SURGICAL HISTORY: Past Surgical History  Procedure Laterality Date  . No past surgeries      FAMILY HISTORY: Family History  Problem Relation Age of Onset  . Asthma Mother   . Hypertension Mother   . Hypertension Father   . Cancer Father 569   Prostate Cancer  . Heart disease Maternal Grandmother   . Heart disease Maternal Grandfather   . Stroke Paternal Grandmother   . Heart disease Paternal Grandfather     SOCIAL HISTORY: History  Social History  . Marital Status: Married    Spouse Name: Ozzie Hoyle     Number of Children: 1  . Years of Education: 0   Occupational History  . HOMEMAKER     Social History Main Topics  . Smoking status: Never Smoker   . Smokeless tobacco: Never Used  . Alcohol Use: No  . Drug Use: No  . Sexual Activity: Yes    Partners: Male    Birth Control/ Protection: None   Other Topics Concern  . Not on file   Social History Narrative   Marital Status:  Married Therapist, art)    Children:  Son Banker)    Pets: Dog (1)    Living Situation: Lives with husband and son   Occupation: Agricultural engineer    Education: Conservator, museum/gallery in [place] Use/Exposure:  None    Alcohol Use:  Occasional   Drug Use:  None   Diet:  Regular   Exercise:  Walking - once weekly    Hobbies: Baking - Sewing                  PHYSICAL EXAM  Filed Vitals:   12/22/13 0910  BP: 105/63  Pulse: 90  Height: 4' 11.02" (1.499 m)  Weight: 141 lb (63.957 kg)   Body mass index is 28.46 kg/(m^2).  Generalized: Well developed, in no acute distress  Head: normocephalic and atraumatic,. Oropharynx benign  Neck: Supple, no carotid bruits  Cardiac: Regular rate rhythm, no murmur  Musculoskeletal: No deformity   Neurological examination   Mentation: Alert oriented to time, place, history taking. Follows all commands speech and language fluent  Cranial nerve II-XII: Fundoscopic exam reveals sharp disc margins.Pupils were equal round reactive to light extraocular movements were full, visual field were full on confrontational test. Facial sensation and strength were normal. hearing was intact to finger rubbing bilaterally. Uvula tongue midline. head turning and shoulder shrug were normal and symmetric.Tongue protrusion into cheek strength was normal. Motor: normal bulk and tone, full strength in the BUE, BLE,  No focal weakness Coordination: finger-nose-finger, heel-to-shin bilaterally, no dysmetria Reflexes: Brachioradialis 2/2, biceps 2/2, triceps 2/2, patellar 2/2, Achilles 2/2, plantar responses were flexor bilaterally. Gait and Station: Rising up from seated position without assistance, normal stance,  moderate stride, good arm swing, smooth turning, able to perform tiptoe, and heel walking without difficulty. Tandem gait is steady  DIAGNOSTIC DATA (LABS, IMAGING, TESTING) - I reviewed patient records, labs, notes, testing and imaging myself where available.  Lab Results  Component Value Date   WBC 5.3 06/22/2013   HGB 12.9 06/22/2013   HCT 38.7 06/22/2013   MCV 88 06/22/2013   PLT 235 04/20/2012      Component Value Date/Time   NA 139  06/22/2013 0939   NA 135 04/20/2012 0525   K 4.7 06/22/2013 0939   CL 100 06/22/2013 0939   CO2 24 06/22/2013 0939   GLUCOSE 86 06/22/2013 0939   GLUCOSE 79 04/20/2012 0525   BUN 10 06/22/2013 0939   BUN 10 04/20/2012 0525   CREATININE 0.68 06/22/2013 0939   CALCIUM 9.4 06/22/2013 0939   PROT 7.0 06/22/2013 0939   PROT 5.0* 04/20/2012 0525   ALBUMIN 2.0* 04/20/2012 0525   AST 33 06/22/2013 0939   ALT 56* 06/22/2013 0939   ALKPHOS 45 06/22/2013 0939   BILITOT 0.3 06/22/2013 0939   GFRNONAA 119 06/22/2013 0939   GFRAA 137 06/22/2013 3888  Lab Results  Component Value Date   TSH 1.280 06/22/2013      ASSESSMENT AND PLAN  33 y.o. year old female  has a past medical history of Crohn's disease; Anemia; and Epilepsy. here followup for her seizure disorder. She is currently [redacted] weeks pregnant and her Lamictal level was checked and it was 1.2 with her Lamictal increased to 250 mgXR daily. Repeat level on 12/03/12 was 4.4 Ms Senteno is a patient of Dr. Krista Blue who is out of the office  Continue Lamictal XR 250 mg daily BRAND Repeat Lamictal level on or around June 1 Lamictal XR 50 mg refilled for mail order BRAND Followup in 3-4 months Dennie Bible, [facility], [facility], [facility] [address] [place],  [address] [phone]

## 2013-12-22 NOTE — Patient Instructions (Signed)
Continue Lamictal XR 250 mg daily Repeat Lamictal level on or around June 1 Lamictal 50 mg refilled for mail order Followup in 3-4 months

## 2013-12-22 NOTE — Telephone Encounter (Signed)
Desiree Marshall has already updated this info.  I called the patient back.  Got no answer.  Left message.

## 2013-12-22 NOTE — Progress Notes (Signed)
I have read the note, and I agree with the clinical assessment and plan.  Desiree Marshall KEITH   

## 2014-02-10 ENCOUNTER — Encounter (HOSPITAL_COMMUNITY): Payer: Self-pay

## 2014-02-10 ENCOUNTER — Inpatient Hospital Stay (HOSPITAL_COMMUNITY)
Admission: AD | Admit: 2014-02-10 | Discharge: 2014-02-10 | Disposition: A | Discharge status: Home / Self Care | Payer: 59 | Source: Ambulatory Visit | Attending: Obstetrics and Gynecology | Admitting: Obstetrics and Gynecology

## 2014-02-10 DIAGNOSIS — O26899 Other specified pregnancy related conditions, unspecified trimester: Secondary | ICD-10-CM

## 2014-02-10 DIAGNOSIS — O99891 Other specified diseases and conditions complicating pregnancy: Secondary | ICD-10-CM

## 2014-02-10 DIAGNOSIS — R109 Unspecified abdominal pain: Secondary | ICD-10-CM

## 2014-02-10 DIAGNOSIS — W19XXXA Unspecified fall, initial encounter: Secondary | ICD-10-CM

## 2014-02-10 DIAGNOSIS — W010XXA Fall on same level from slipping, tripping and stumbling without subsequent striking against object, initial encounter: Secondary | ICD-10-CM | POA: Insufficient documentation

## 2014-02-10 DIAGNOSIS — O9989 Other specified diseases and conditions complicating pregnancy, childbirth and the puerperium: Principal | ICD-10-CM

## 2014-02-10 LAB — URINALYSIS, ROUTINE W REFLEX MICROSCOPIC
BILIRUBIN URINE: NEGATIVE
Glucose, UA: NEGATIVE mg/dL
Hgb urine dipstick: NEGATIVE
Ketones, ur: NEGATIVE mg/dL
LEUKOCYTES UA: NEGATIVE
NITRITE: NEGATIVE
PH: 7.5 (ref 5.0–8.0)
Protein, ur: NEGATIVE mg/dL
SPECIFIC GRAVITY, URINE: 1.015 (ref 1.005–1.030)
Urobilinogen, UA: 0.2 mg/dL (ref 0.0–1.0)

## 2014-02-10 NOTE — MAU Note (Signed)
Pt presents after a fall 2 days ago complaining of tightening in her abdomen. Reports good fetal movement. Denies any vaginal bleeding or discharge.

## 2014-02-10 NOTE — MAU Provider Note (Signed)
History     CSN: [id number]  Arrival date and time: 02/10/14 1450   First Provider Initiated Contact with Patient 02/10/14 1531      Chief Complaint  Patient presents with  . Fall   HPI  [name] is a 33 y.o.female G2P0101 at [redacted]w[redacted]d who presents with contraction like pain following a fall >48 hours ago.  On Wednesday, the patient tripped over a ball and skinned her knee. She thought she caught the fall with her arms; she ended up on her side. She did not land on her abdomen.  She called the office after she fell and she was told to rest and come to MAU if pain developed. She had no pain for about 24 hours. Beginning yesterday afternoon she started experiencing pain on both sides of her abdomen/ hip area " and the baby felt low", however she denies pelvic pressure. Today around 1200 noon she started feeling the side pains again. She describes good fetal movement and she denies vaginal bleeding or leaking of fluid.   OB History   Grav Para Term Preterm Abortions TAB SAB Ect Mult Living   2 1 0 1 0 0 0 0 0 1       Past Medical History  Diagnosis Date  . Crohn's disease   . Astigmatism   . Anemia     Resolved  . Epilepsy     Diagnosed at the age of 23.     Past Surgical History  Procedure Laterality Date  . No past surgeries      Family History  Problem Relation Age of Onset  . Asthma Mother   . Hypertension Mother   . Hypertension Father   . Cancer Father 46    Prostate Cancer  . Heart disease Maternal Grandmother   . Heart disease Maternal Grandfather   . Stroke Paternal Grandmother   . Heart disease Paternal Grandfather     History  Substance Use Topics  . Smoking status: Never Smoker   . Smokeless tobacco: Never Used  . Alcohol Use: No    Allergies:  Allergies  Allergen Reactions  . Promethazine Hcl Other (See Comments)    hallucinations    Prescriptions prior to admission  Medication Sig Dispense Refill  . acetaminophen (TYLENOL) 500 MG  tablet Take 1,000 mg by mouth every 6 (six) hours as needed for headache.      . adalimumab (HUMIRA) 40 MG/0.8ML injection Inject 40 mg into the skin once a week.       . azaTHIOprine (IMURAN) 50 MG tablet Take 50 mg by mouth daily.      . calcium carbonate (TUMS - DOSED IN MG ELEMENTAL CALCIUM) 500 MG chewable tablet Chew 1 tablet by mouth daily as needed for indigestion or heartburn.      . cholecalciferol (VITAMIN D) 1000 UNITS tablet Take 1,000 Units by mouth daily. Taking 2 pills daily      . folic acid (FOLVITE) 400 MCG tablet Take 400 mcg by mouth daily. Takes 8 pills daily.      Marland Kitchen LAMICTAL XR 50 MG TB24 Take 1 tablet (50 mg total) by mouth daily. BRAND ONLY  90 tablet  1  . Prenat w/o A-FeCbn-Meth-FA-DHA (PRENATE MINI) 29-0.6-0.4-350 MG CAPS       . LamoTRIgine XR (LAMICTAL XR) 200 MG TB24 TAKE 1 TABLET DAILY BRAND ONLY       Results for orders placed during the hospital encounter of 02/10/14 (from the past 48 hour(s))  URINALYSIS, ROUTINE W REFLEX MICROSCOPIC     Status: None   Collection Time    02/10/14  2:50 PM      Result Value Ref Range   Color, Urine YELLOW  YELLOW   APPearance CLEAR  CLEAR   Specific Gravity, Urine 1.015  1.005 - 1.030   pH 7.5  5.0 - 8.0   Glucose, UA NEGATIVE  NEGATIVE mg/dL   Hgb urine dipstick NEGATIVE  NEGATIVE   Bilirubin Urine NEGATIVE  NEGATIVE   Ketones, ur NEGATIVE  NEGATIVE mg/dL   Protein, ur NEGATIVE  NEGATIVE mg/dL   Urobilinogen, UA 0.2  0.0 - 1.0 mg/dL   Nitrite NEGATIVE  NEGATIVE   Leukocytes, UA NEGATIVE  NEGATIVE   Comment: MICROSCOPIC NOT DONE ON URINES WITH NEGATIVE PROTEIN, BLOOD, LEUKOCYTES, NITRITE, OR GLUCOSE <1000 mg/dL.    Review of Systems  Constitutional: Negative for fever and chills.  Gastrointestinal: Positive for abdominal pain (Some upper abdominal pain; contraction/tightening pain.). Negative for nausea and vomiting.  Genitourinary: Negative for dysuria, urgency, frequency and hematuria.       No vaginal  discharge. No vaginal bleeding. No dysuria.    Physical Exam   Blood pressure 118/71, pulse 100, temperature 98 F (36.7 C), temperature source Oral, resp. rate 18, not currently breastfeeding.  Physical Exam  Constitutional: She is oriented to person, place, and time. She appears well-developed and well-nourished. No distress.  HENT:  Head: Normocephalic.  Eyes: Pupils are equal, round, and reactive to light.  Neck: Neck supple.  Respiratory: Effort normal.  GI: Soft. She exhibits no distension. There is no tenderness.  Musculoskeletal: Normal range of motion.  Neurological: She is alert and oriented to person, place, and time.  Skin: Skin is warm. She is not diaphoretic.  Psychiatric: Her behavior is normal.   Fetal Tracing: Baseline: 140 bpm  Variability: Moderate Accelerations: 10x10 Decelerations: none Toco: no contractions noted   MAU Course  Procedures None  MDM UA  Discussed fetal tracing with Dr. Claiborne Billingsallahan A negative blood type: Pt denies vaginal bleeding  NST    Assessment and Plan   A:  1. Fall   2. Abdominal pain in pregnancy    P:  Discharge home in stable condition Return if symptoms worsen Preterm labor precautions discussed Keep your follow up appointment; call the office if need to be seen sooner.   Iona HansenJennifer Irene Sianni Cloninger, NP 02/10/2014, 5:12 PM

## 2014-02-11 ENCOUNTER — Other Ambulatory Visit: Payer: Self-pay | Admitting: Neurology

## 2014-03-13 ENCOUNTER — Encounter (HOSPITAL_COMMUNITY): Payer: Self-pay | Admitting: *Deleted

## 2014-03-13 ENCOUNTER — Inpatient Hospital Stay (HOSPITAL_COMMUNITY)
Admission: AD | Admit: 2014-03-13 | Discharge: 2014-03-13 | Disposition: A | Discharge status: Home / Self Care | Payer: 59 | Source: Ambulatory Visit | Attending: Obstetrics & Gynecology | Admitting: Obstetrics & Gynecology

## 2014-03-13 DIAGNOSIS — O99891 Other specified diseases and conditions complicating pregnancy: Secondary | ICD-10-CM | POA: Insufficient documentation

## 2014-03-13 DIAGNOSIS — O9989 Other specified diseases and conditions complicating pregnancy, childbirth and the puerperium: Principal | ICD-10-CM

## 2014-03-13 DIAGNOSIS — O26893 Other specified pregnancy related conditions, third trimester: Secondary | ICD-10-CM

## 2014-03-13 DIAGNOSIS — R12 Heartburn: Secondary | ICD-10-CM | POA: Insufficient documentation

## 2014-03-13 DIAGNOSIS — E860 Dehydration: Secondary | ICD-10-CM

## 2014-03-13 DIAGNOSIS — R0602 Shortness of breath: Secondary | ICD-10-CM | POA: Insufficient documentation

## 2014-03-13 DIAGNOSIS — K5090 Crohn's disease, unspecified, without complications: Secondary | ICD-10-CM | POA: Insufficient documentation

## 2014-03-13 LAB — URINALYSIS, ROUTINE W REFLEX MICROSCOPIC
BILIRUBIN URINE: NEGATIVE
Glucose, UA: NEGATIVE mg/dL
HGB URINE DIPSTICK: NEGATIVE
Ketones, ur: 15 mg/dL — AB
Leukocytes, UA: NEGATIVE
Nitrite: NEGATIVE
PH: 7 (ref 5.0–8.0)
Protein, ur: NEGATIVE mg/dL
SPECIFIC GRAVITY, URINE: 1.02 (ref 1.005–1.030)
Urobilinogen, UA: 2 mg/dL — ABNORMAL HIGH (ref 0.0–1.0)

## 2014-03-13 NOTE — MAU Note (Signed)
Starting last night and then this morning.  Feeling short of breath and heartburn. Krohne's has been acting up today.

## 2014-03-13 NOTE — MAU Provider Note (Signed)
Reviewed case with PA. Per MAU provider low suspicion for cardiac or pulmonary etiology of her shortness of breath.  Recommended patient increase her hydration as she had several episodes of diarrhea due to Crohn's flare.  She will f/u in office accordingly.   Desiree Marshall STACIA

## 2014-03-13 NOTE — Discharge Instructions (Signed)
Dehydration, Adult Dehydration is when you lose more fluids from the body than you take in. Vital organs like the kidneys, brain, and heart cannot function without a proper amount of fluids and salt. Any loss of fluids from the body can cause dehydration.  CAUSES   Vomiting.  Diarrhea.  Excessive sweating.  Excessive urine output.  Fever. SYMPTOMS  Mild dehydration  Thirst.  Dry lips.  Slightly dry mouth. Moderate dehydration  Very dry mouth.  Sunken eyes.  Skin does not bounce back quickly when lightly pinched and released.  Dark urine and decreased urine production.  Decreased tear production.  Headache. Severe dehydration  Very dry mouth.  Extreme thirst.  Rapid, weak pulse (more than 100 beats per minute at rest).  Cold hands and feet.  Not able to sweat in spite of heat and temperature.  Rapid breathing.  Blue lips.  Confusion and lethargy.  Difficulty being awakened.  Minimal urine production.  No tears. DIAGNOSIS  Your caregiver will diagnose dehydration based on your symptoms and your exam. Blood and urine tests will help confirm the diagnosis. The diagnostic evaluation should also identify the cause of dehydration. TREATMENT  Treatment of mild or moderate dehydration can often be done at home by increasing the amount of fluids that you drink. It is best to drink small amounts of fluid more often. Drinking too much at one time can make vomiting worse. Refer to the home care instructions below. Severe dehydration needs to be treated at the hospital where you will probably be given intravenous (IV) fluids that contain water and electrolytes. HOME CARE INSTRUCTIONS   Ask your caregiver about specific rehydration instructions.  Drink enough fluids to keep your urine clear or pale yellow.  Drink small amounts frequently if you have nausea and vomiting.  Eat as you normally do.  Avoid:  Foods or drinks high in sugar.  Carbonated  drinks.  Juice.  Extremely hot or cold fluids.  Drinks with caffeine.  Fatty, greasy foods.  Alcohol.  Tobacco.  Overeating.  Gelatin desserts.  Wash your hands well to avoid spreading bacteria and viruses.  Only take over-the-counter or prescription medicines for pain, discomfort, or fever as directed by your caregiver.  Ask your caregiver if you should continue all prescribed and over-the-counter medicines.  Keep all follow-up appointments with your caregiver. SEEK MEDICAL CARE IF:  You have abdominal pain and it increases or stays in one area (localizes).  You have a rash, stiff neck, or severe headache.  You are irritable, sleepy, or difficult to awaken.  You are weak, dizzy, or extremely thirsty. SEEK IMMEDIATE MEDICAL CARE IF:   You are unable to keep fluids down or you get worse despite treatment.  You have frequent episodes of vomiting or diarrhea.  You have blood or green matter (bile) in your vomit.  You have blood in your stool or your stool looks black and tarry.  You have not urinated in 6 to 8 hours, or you have only urinated a small amount of very dark urine.  You have a fever.  You faint. MAKE SURE YOU:   Understand these instructions.  Will watch your condition.  Will get help right away if you are not doing well or get worse. Document Released: 09/15/2005 Document Revised: 12/08/2011 Document Reviewed: 05/05/2011 [facility] Patient Information 2014 [employer] [place]. Heartburn During Pregnancy  Heartburn is a burning sensation in the chest caused by stomach acid backing up into the esophagus. Heartburn is common in pregnancy because a  certain hormone (progesterone) is released when a woman is pregnant. The progesterone hormone may relax the valve that separates the esophagus from the stomach. This allows acid to go up into the esophagus, causing heartburn. Heartburn may also happen in pregnancy because the enlarging uterus pushes up on the  stomach, which pushes more acid into the esophagus. This is especially true in the later stages of pregnancy. Heartburn problems usually go away after giving birth. CAUSES  Heartburn is caused by stomach acid backing up into the esophagus. During pregnancy, this may result from various things, including:   The progesterone hormone.  Changing hormone levels.  The growing uterus pushing stomach acid upward.  Large meals.  Certain foods and drinks.  Exercise.  Increased acid production. SIGNS AND SYMPTOMS   Burning pain in the chest or lower throat.  Bitter taste in the mouth.  Coughing. DIAGNOSIS  Your health care provider will typically diagnose heartburn by taking a careful history of your concern. Blood tests may be done to check for a certain type of bacteria that is associated with heartburn. Sometimes, heartburn is diagnosed by prescribing a heartburn medicine to see if the symptoms improve. In some cases, a procedure called an endoscopy may be done. In this procedure, a tube with a light and a camera on the end (endoscope) is used to examine the esophagus and the stomach. TREATMENT  Treatment will vary depending on the severity of your symptoms. Your health care provider may recommend:  Over-the-counter medicines (antacids, acid reducers) for mild heartburn.  Prescription medicines to decrease stomach acid or to protect your stomach lining.  Certain changes in your diet.  Elevating the head of your bed by putting blocks under the legs. This helps prevent stomach acid from backing up into the esophagus when you are lying down. HOME CARE INSTRUCTIONS   Only take over-the-counter or prescription medicines as directed by your health care provider.  Raise the head of your bed by putting blocks under the legs if instructed to do so by your health care provider. Sleeping with more pillows is not effective because it only changes the position of your head.  Do not exercise  right after eating.  Avoid eating 2 3 hours before bed. Do not lie down right after eating.  Eat small meals throughout the day instead of three large meals.  Identify foods and beverages that make your symptoms worse and avoid them. Foods you may want to avoid include:  Peppers.  Chocolate.  High-fat foods, including fried foods.  Spicy foods.  Garlic and onions.  Citrus fruits, including oranges, grapefruit, lemons, and limes.  Food containing tomatoes or tomato products.  Mint.  Carbonated and caffeinated drinks.  Vinegar. SEEK MEDICAL CARE IF:  You have abdominal pain of any kind.  You feel burning in your upper abdomen or chest, especially after eating or lying down.  You have nausea and vomiting.  Your stomach feels upset after you eat. SEEK IMMEDIATE MEDICAL CARE IF:   You have severe chest pain that goes down your arm or into your jaw or neck.  You feel sweaty, dizzy, or lightheaded.  You become short of breath.  You vomit blood.  You have difficulty or pain with swallowing.  You have bloody or black, tarry stools.  You have episodes of heartburn more than 3 times a week, for more than 2 weeks. MAKE SURE YOU:  Understand these instructions.  Will watch your condition.  Will get help right away if you  are not doing well or get worse. Document Released: 09/12/2000 Document Revised: 07/06/2013 Document Reviewed: 05/04/2013 [facility] Patient Information 2014 [place], [place].

## 2014-03-13 NOTE — MAU Provider Note (Signed)
History     CSN: [id number]  Arrival date and time: 03/13/14 1718   First Provider Initiated Contact with Patient 03/13/14 1756      Chief Complaint  Patient presents with  . Shortness of Breath  . Heartburn   HPI [name] is a 33 y.o. G2P0101 at 4158w5d who presents to MAU today from the office for further evaluation of SOB. The patient states that it started last night and has continued today. She feels it is worse with laying down. She has also felt fatigued today. She endorses 3 loose stools today which she attributes to a flair of her crohn's disease. She denies N/V today. She has had a slight decreased in appetite. She also has heartburn for which she takes TUMs with some relief. She denies vaginal bleeding, discharge, LOF today and reports good fetal movement.   OB History   Grav Para Term Preterm Abortions TAB SAB Ect Mult Living   2 1 0 1 0 0 0 0 0 1       Past Medical History  Diagnosis Date  . Crohn's disease   . Astigmatism   . Anemia     Resolved  . Epilepsy     Diagnosed at the age of 89.     Past Surgical History  Procedure Laterality Date  . No past surgeries      Family History  Problem Relation Age of Onset  . Asthma Mother   . Hypertension Mother   . Hypertension Father   . Cancer Father 559    Prostate Cancer  . Heart disease Maternal Grandmother   . Heart disease Maternal Grandfather   . Stroke Paternal Grandmother   . Heart disease Paternal Grandfather     History  Substance Use Topics  . Smoking status: Never Smoker   . Smokeless tobacco: Never Used  . Alcohol Use: No    Allergies:  Allergies  Allergen Reactions  . Promethazine Hcl Other (See Comments)    hallucinations    Prescriptions prior to admission  Medication Sig Dispense Refill  . acetaminophen (TYLENOL) 500 MG tablet Take 1,000 mg by mouth every 6 (six) hours as needed for headache.      . adalimumab (HUMIRA) 40 MG/0.8ML injection Inject 40 mg into the skin  once a week.       . azaTHIOprine (IMURAN) 50 MG tablet Take 50 mg by mouth daily.      . calcium carbonate (TUMS - DOSED IN MG ELEMENTAL CALCIUM) 500 MG chewable tablet Chew 1 tablet by mouth daily as needed for indigestion or heartburn.      . cholecalciferol (VITAMIN D) 1000 UNITS tablet Take 1,000 Units by mouth daily. Taking 2 pills daily      . folic acid (FOLVITE) 400 MCG tablet Take 400 mcg by mouth daily. Takes 8 pills daily.      Marland Kitchen. LAMICTAL XR 200 MG TB24 TAKE 1 TABLET DAILY  90 tablet  0  . LAMICTAL XR 50 MG TB24 Take 1 tablet (50 mg total) by mouth daily. BRAND ONLY  90 tablet  1  . LamoTRIgine XR (LAMICTAL XR) 200 MG TB24 TAKE 1 TABLET DAILY BRAND ONLY      . Prenat w/o A-FeCbn-Meth-FA-DHA (PRENATE MINI) 29-0.6-0.4-350 MG CAPS         Review of Systems  Constitutional: Negative for fever.  Gastrointestinal: Positive for diarrhea. Negative for nausea, vomiting, abdominal pain and constipation.  Genitourinary: Negative for dysuria, urgency and frequency.  Neg - vaginal bleeding, discharge, LOF   Physical Exam   Blood pressure 122/73, pulse 116, temperature 98.2 F (36.8 C), temperature source Oral, resp. rate 18, SpO2 99.00%, not currently breastfeeding.  Physical Exam  Constitutional: She is oriented to person, place, and time. She appears well-developed and well-nourished. No distress.  HENT:  Head: Normocephalic and atraumatic.  Cardiovascular: Normal rate, regular rhythm and normal heart sounds.   Respiratory: Effort normal and breath sounds normal. No respiratory distress.  GI: Soft. Bowel sounds are normal. She exhibits no distension and no mass. There is no tenderness. There is no rebound and no guarding.  Neurological: She is alert and oriented to person, place, and time.  Skin: Skin is warm and dry. No erythema.  Psychiatric: She has a normal mood and affect.   Results for orders placed during the hospital encounter of 03/13/14 (from the past 24 hour(s))   URINALYSIS, ROUTINE W REFLEX MICROSCOPIC     Status: Abnormal   Collection Time    03/13/14  5:33 PM      Result Value Ref Range   Color, Urine YELLOW  YELLOW   APPearance CLEAR  CLEAR   Specific Gravity, Urine 1.020  1.005 - 1.030   pH 7.0  5.0 - 8.0   Glucose, UA NEGATIVE  NEGATIVE mg/dL   Hgb urine dipstick NEGATIVE  NEGATIVE   Bilirubin Urine NEGATIVE  NEGATIVE   Ketones, ur 15 (*) NEGATIVE mg/dL   Protein, ur NEGATIVE  NEGATIVE mg/dL   Urobilinogen, UA 2.0 (*) 0.0 - 1.0 mg/dL   Nitrite NEGATIVE  NEGATIVE   Leukocytes, UA NEGATIVE  NEGATIVE   Fetal Monitoring: Baseline: 145 bpm, moderate variability, + accelerations, no decelerations Contractions: none  MAU Course  Procedures None  MDM Discussed with Dr. Mora Appl. Advised patient to increased PO hydration. Return if chest pain or pressure develops.   Assessment and Plan  A: Dehydration Heartburn  P: Discharge home Patient advised to increased PO hydration as tolerated Patient advised of warning sign that warrant return to MAU Patient advised to keep appointment for routine prenatal care with [facility] Patient may return to MAU as needed or if her condition were to change or worsen   Freddi Starr, PA-C  03/13/2014, 6:04 PM

## 2014-03-24 ENCOUNTER — Encounter: Payer: Self-pay | Admitting: Neurology

## 2014-03-24 ENCOUNTER — Ambulatory Visit (INDEPENDENT_AMBULATORY_CARE_PROVIDER_SITE_OTHER): Payer: 59 | Admitting: Neurology

## 2014-03-24 ENCOUNTER — Telehealth: Payer: Self-pay | Admitting: Neurology

## 2014-03-24 VITALS — Ht 59.0 in | Wt 150.0 lb

## 2014-03-24 DIAGNOSIS — R569 Unspecified convulsions: Secondary | ICD-10-CM

## 2014-03-24 DIAGNOSIS — K5090 Crohn's disease, unspecified, without complications: Secondary | ICD-10-CM

## 2014-03-24 NOTE — Telephone Encounter (Signed)
I called the patient.  Provided info for her to call [facility] for assistance with meds, [phone].  She will contact them and call us back if anything further is needed.

## 2014-03-24 NOTE — Progress Notes (Signed)
[facility]  PATIENT: Desiree Marshall DOB: April 07, 1981   REASON FOR VISIT: Followup for seizure disorder   HISTORY OF PRESENT ILLNESS:Ms Sawyers, 33 year old female returns for followup. She has a history of seizure disorder, she is currently [redacted] weeks pregnant.  This is her second pregnancy,  Her last seizure was in 2009. She is currently taking brand Lamictal xr  250, qhs, level was 4.4 in March 2015.  So far she has had no problems with her pregnancy. She also has a history of Crohn's disease and is followed at [facility]. She returns for reevaluation  HISTORY: She has history of seizures since she was a teenager, partial complex seizures, she would have aura of speech difficulty, confused, hot before she loss of consciousness, going into convulsion.  All the recurrent seizure is due to medications changes or missing dose. She totally had 3 seizures in her life time.  She reported previously normal MRI of the brain, but abnormal EEG, I do not have detailed report anymore.  Previous pregnancy was successful while taking brand-name Lamictal, She delivered baby boy on 04/18/12.  She used to take brand-name Lamictal xr 250 mg every night, only during recent visit in March 2015, it was increased to 250, based on the blood  levels,   REVIEW OF SYSTEMS: Full 14 system review of systems performed and notable only for those listed, all others are neg:   Frequent urination, headache, fatigue, shortness of breath, joint pain   ALLERGIES: Allergies  Allergen Reactions  . Promethazine Hcl Other (See Comments)    hallucinations    HOME MEDICATIONS: Outpatient Prescriptions Prior to Visit  Medication Sig Dispense Refill  . acetaminophen (TYLENOL) 500 MG tablet Take 1,000 mg by mouth every 6 (six) hours as needed for headache.      . adalimumab (HUMIRA) 40 MG/0.8ML injection Inject 40 mg into the skin every 14 (fourteen) days.       Marland Kitchen azaTHIOprine (IMURAN) 50 MG tablet Take 50  mg by mouth daily.      . calcium carbonate (TUMS - DOSED IN MG ELEMENTAL CALCIUM) 500 MG chewable tablet Chew 1 tablet by mouth daily as needed for indigestion or heartburn.      . cholecalciferol (VITAMIN D) 1000 UNITS tablet Take 1,000 Units by mouth daily. Taking 2 pills daily      . folic acid (FOLVITE) 412 MCG tablet Take 400 mcg by mouth daily. Takes 8 pills daily.      Marland Kitchen LAMICTAL XR 50 MG TB24 Take 1 tablet (50 mg total) by mouth daily. BRAND ONLY  90 tablet  1  . LamoTRIgine XR (LAMICTAL XR) 200 MG TB24 TAKE 1 TABLET DAILY BRAND ONLY      . Prenat w/o A-FeCbn-Meth-FA-DHA (PRENATE MINI) 29-0.6-0.4-350 MG CAPS Take 1 tablet by mouth daily.        No facility-administered medications prior to visit.    PAST MEDICAL HISTORY: Past Medical History  Diagnosis Date  . Crohn's disease   . Astigmatism   . Anemia     Resolved  . Epilepsy     Diagnosed at the age of 80.     PAST SURGICAL HISTORY: Past Surgical History  Procedure Laterality Date  . No past surgeries      FAMILY HISTORY: Family History  Problem Relation Age of Onset  . Asthma Mother   . Hypertension Mother   . Hypertension Father   . Cancer Father 59    Prostate Cancer  . Heart  disease Maternal Grandmother   . Heart disease Maternal Grandfather   . Stroke Paternal Grandmother   . Heart disease Paternal Grandfather     SOCIAL HISTORY: History   Social History  . Marital Status: Married    Spouse Name: Ozzie Hoyle     Number of Children: 1  . Years of Education: 0   Occupational History  . HOMEMAKER     Social History Main Topics  . Smoking status: Never Smoker   . Smokeless tobacco: Never Used  . Alcohol Use: No  . Drug Use: No  . Sexual Activity: Yes    Partners: Male    Birth Control/ Protection: None   Other Topics Concern  . Not on file   Social History Narrative   Marital Status:  Married Therapist, art)    Children:  Son Banker)    Pets: Dog (1)    Living Situation: Lives with husband and  son   Occupation: Agricultural engineer    Education: Conservator, museum/gallery in [place] Use/Exposure:  None    Alcohol Use:  Occasional   Drug Use:  None   Diet:  Regular   Exercise:  Walking - once weekly    Hobbies: Baking - Sewing                  PHYSICAL EXAM  Filed Vitals:   03/24/14 1123  Height: 4' 11"  (1.499 m)  Weight: 150 lb (68.04 kg)   Body mass index is 30.28 kg/(m^2).  Generalized: Well developed, in no acute distress  Head: normocephalic and atraumatic,. Oropharynx benign  Neck: Supple, no carotid bruits  Cardiac: Regular rate rhythm, no murmur  Musculoskeletal: No deformity   Neurological examination   Mentation: Alert oriented to time, place, history taking. Follows all commands speech and language fluent  Cranial nerve II-XII: Fundoscopic exam reveals sharp disc margins.Pupils were equal round reactive to light extraocular movements were full, visual field were full on confrontational test. Facial sensation and strength were normal. hearing was intact to finger rubbing bilaterally. Uvula tongue midline. head turning and shoulder shrug were normal and symmetric.Tongue protrusion into cheek strength was normal. Motor: normal bulk and tone, full strength in the BUE, BLE,  No focal weakness Coordination: finger-nose-finger, heel-to-shin bilaterally, no dysmetria Reflexes: Brachioradialis 2/2, biceps 2/2, triceps 2/2, patellar 2/2, Achilles 2/2, plantar responses were flexor bilaterally. Gait and Station: Rising up from seated position without assistance, normal stance,  moderate stride, good arm swing, smooth turning, able to perform tiptoe, and heel walking without difficulty. Tandem gait is steady  DIAGNOSTIC DATA (LABS, IMAGING, TESTING) - I reviewed patient records, labs, notes, testing and imaging myself where available.  Lab Results  Component Value Date   WBC 5.3 06/22/2013   HGB 12.9 06/22/2013   HCT 38.7 06/22/2013   MCV 88 06/22/2013    PLT 235 04/20/2012      Component Value Date/Time   NA 139 06/22/2013 0939   NA 135 04/20/2012 0525   K 4.7 06/22/2013 0939   CL 100 06/22/2013 0939   CO2 24 06/22/2013 0939   GLUCOSE 86 06/22/2013 0939   GLUCOSE 79 04/20/2012 0525   BUN 10 06/22/2013 0939   BUN 10 04/20/2012 0525   CREATININE 0.68 06/22/2013 0939   CALCIUM 9.4 06/22/2013 0939   PROT 7.0 06/22/2013 0939   PROT 5.0* 04/20/2012 0525   ALBUMIN 2.0* 04/20/2012 0525   AST 33 06/22/2013 0939   ALT 56* 06/22/2013 6295  ALKPHOS 45 06/22/2013 0939   BILITOT 0.3 06/22/2013 0939   GFRNONAA 119 06/22/2013 0939   GFRAA 137 06/22/2013 0939    Lab Results  Component Value Date   TSH 1.280 06/22/2013   ASSESSMENT AND PLAN  33 y.o. year old female  has a past medical history of Crohn's disease; Anemia; and Epilepsy. here followup for her seizure disorder. She is currently [redacted] weeks pregnant    Continue Lamictal XR 250 mg daily BRAND Repeat Lamictal level today RTC in 3 months with Rhae Hammock, M.D. Ph.D.   [facility] [address], [place], [place] [address] [phone]

## 2014-03-24 NOTE — Telephone Encounter (Signed)
Desiree Marshall, she is taking brand name Lamictal xr 250 mg once every night, has been on it for many years, would you please help her contact pharmaceutical company,  to see if we can help her with the payment.

## 2014-03-27 LAB — LAMOTRIGINE LEVEL: Lamotrigine Lvl: 4.5 ug/mL (ref 2.0–20.0)

## 2014-03-28 ENCOUNTER — Telehealth: Payer: Self-pay | Admitting: Neurology

## 2014-03-28 NOTE — Telephone Encounter (Signed)
Please call patient, lamotrigine level was 4.5, low normal range, keep current dose of medications,

## 2014-03-29 NOTE — Telephone Encounter (Signed)
Called Desiree Marshall to inform her per Dr. Krista Blue that her lamotrigine level was 4.5, low normal range and to keep current does of medication. I advised the Desiree Marshall the if she has any other problems, questions or concerns to call the office. Desiree Marshall verbalized understanding.

## 2014-04-27 LAB — OB RESULTS CONSOLE GBS: STREP GROUP B AG: NEGATIVE

## 2014-05-05 ENCOUNTER — Inpatient Hospital Stay (HOSPITAL_COMMUNITY)
Admission: AD | Admit: 2014-05-05 | Discharge: 2014-05-05 | Disposition: A | Discharge status: Home / Self Care | Payer: 59 | Source: Ambulatory Visit | Attending: Obstetrics and Gynecology | Admitting: Obstetrics and Gynecology

## 2014-05-05 ENCOUNTER — Encounter (HOSPITAL_COMMUNITY): Payer: Self-pay | Admitting: *Deleted

## 2014-05-05 DIAGNOSIS — O26619 Liver and biliary tract disorders in pregnancy, unspecified trimester: Principal | ICD-10-CM | POA: Diagnosis present

## 2014-05-05 DIAGNOSIS — G40909 Epilepsy, unspecified, not intractable, without status epilepticus: Secondary | ICD-10-CM | POA: Diagnosis present

## 2014-05-05 DIAGNOSIS — Z8249 Family history of ischemic heart disease and other diseases of the circulatory system: Secondary | ICD-10-CM

## 2014-05-05 DIAGNOSIS — D649 Anemia, unspecified: Secondary | ICD-10-CM | POA: Diagnosis present

## 2014-05-05 DIAGNOSIS — K5090 Crohn's disease, unspecified, without complications: Secondary | ICD-10-CM | POA: Diagnosis present

## 2014-05-05 DIAGNOSIS — O99354 Diseases of the nervous system complicating childbirth: Secondary | ICD-10-CM | POA: Diagnosis present

## 2014-05-05 DIAGNOSIS — K8020 Calculus of gallbladder without cholecystitis without obstruction: Secondary | ICD-10-CM | POA: Diagnosis present

## 2014-05-05 DIAGNOSIS — Z825 Family history of asthma and other chronic lower respiratory diseases: Secondary | ICD-10-CM

## 2014-05-05 DIAGNOSIS — O479 False labor, unspecified: Secondary | ICD-10-CM

## 2014-05-05 DIAGNOSIS — K838 Other specified diseases of biliary tract: Secondary | ICD-10-CM | POA: Diagnosis present

## 2014-05-05 DIAGNOSIS — O26899 Other specified pregnancy related conditions, unspecified trimester: Secondary | ICD-10-CM | POA: Diagnosis present

## 2014-05-05 DIAGNOSIS — Z823 Family history of stroke: Secondary | ICD-10-CM

## 2014-05-05 DIAGNOSIS — O9903 Anemia complicating the puerperium: Secondary | ICD-10-CM | POA: Diagnosis present

## 2014-05-05 DIAGNOSIS — K219 Gastro-esophageal reflux disease without esophagitis: Secondary | ICD-10-CM | POA: Diagnosis present

## 2014-05-05 MED ORDER — BUTORPHANOL TARTRATE 1 MG/ML IJ SOLN
2.0000 mg | Freq: Once | INTRAMUSCULAR | Status: AC
  Administered 2014-05-05: 2 mg via INTRAMUSCULAR
  Filled 2014-05-05 (×2): qty 2

## 2014-05-05 NOTE — MAU Note (Signed)
Patient states she is having contractions every 5 minutes. States she has been unable to hold her bladder. Denies leaking other than the urinary urgency or bleeding and reports good fetal movement.

## 2014-05-05 NOTE — Discharge Instructions (Signed)
See attached instructions for when to return to hospital or return if contractions worsen, membranes rupture (water breaks), or any other concerns.

## 2014-05-06 ENCOUNTER — Other Ambulatory Visit: Payer: Self-pay | Admitting: Neurology

## 2014-05-08 ENCOUNTER — Encounter (HOSPITAL_COMMUNITY): Payer: Self-pay | Admitting: *Deleted

## 2014-05-08 ENCOUNTER — Inpatient Hospital Stay (HOSPITAL_COMMUNITY)
Admission: AD | Admit: 2014-05-08 | Discharge: 2014-05-11 | DRG: 765 | Disposition: A | Discharge status: Home / Self Care | Payer: 59 | Source: Ambulatory Visit | Attending: Obstetrics and Gynecology | Admitting: Obstetrics and Gynecology

## 2014-05-08 DIAGNOSIS — O26899 Other specified pregnancy related conditions, unspecified trimester: Secondary | ICD-10-CM | POA: Diagnosis present

## 2014-05-08 DIAGNOSIS — Z9889 Other specified postprocedural states: Secondary | ICD-10-CM

## 2014-05-08 DIAGNOSIS — D649 Anemia, unspecified: Secondary | ICD-10-CM | POA: Diagnosis present

## 2014-05-08 DIAGNOSIS — O9903 Anemia complicating the puerperium: Secondary | ICD-10-CM | POA: Diagnosis present

## 2014-05-08 DIAGNOSIS — K8020 Calculus of gallbladder without cholecystitis without obstruction: Secondary | ICD-10-CM | POA: Diagnosis present

## 2014-05-08 DIAGNOSIS — K831 Obstruction of bile duct: Secondary | ICD-10-CM | POA: Diagnosis present

## 2014-05-08 DIAGNOSIS — Z823 Family history of stroke: Secondary | ICD-10-CM | POA: Diagnosis not present

## 2014-05-08 DIAGNOSIS — K5090 Crohn's disease, unspecified, without complications: Secondary | ICD-10-CM | POA: Diagnosis present

## 2014-05-08 DIAGNOSIS — K838 Other specified diseases of biliary tract: Secondary | ICD-10-CM | POA: Diagnosis present

## 2014-05-08 DIAGNOSIS — O26619 Liver and biliary tract disorders in pregnancy, unspecified trimester: Secondary | ICD-10-CM

## 2014-05-08 DIAGNOSIS — O26649 Intrahepatic cholestasis of pregnancy, unspecified trimester: Secondary | ICD-10-CM | POA: Diagnosis present

## 2014-05-08 DIAGNOSIS — Z825 Family history of asthma and other chronic lower respiratory diseases: Secondary | ICD-10-CM | POA: Diagnosis not present

## 2014-05-08 DIAGNOSIS — K219 Gastro-esophageal reflux disease without esophagitis: Secondary | ICD-10-CM | POA: Diagnosis present

## 2014-05-08 DIAGNOSIS — O99354 Diseases of the nervous system complicating childbirth: Secondary | ICD-10-CM | POA: Diagnosis present

## 2014-05-08 DIAGNOSIS — Z3490 Encounter for supervision of normal pregnancy, unspecified, unspecified trimester: Secondary | ICD-10-CM

## 2014-05-08 DIAGNOSIS — G40909 Epilepsy, unspecified, not intractable, without status epilepticus: Secondary | ICD-10-CM | POA: Diagnosis present

## 2014-05-08 DIAGNOSIS — Z8249 Family history of ischemic heart disease and other diseases of the circulatory system: Secondary | ICD-10-CM | POA: Diagnosis not present

## 2014-05-08 LAB — COMPREHENSIVE METABOLIC PANEL
ALBUMIN: 2.5 g/dL — AB (ref 3.5–5.2)
ALK PHOS: 185 U/L — AB (ref 39–117)
ALT: 19 U/L (ref 0–35)
ANION GAP: 12 (ref 5–15)
AST: 23 U/L (ref 0–37)
BUN: 8 mg/dL (ref 6–23)
CO2: 22 mEq/L (ref 19–32)
Calcium: 9.3 mg/dL (ref 8.4–10.5)
Chloride: 101 mEq/L (ref 96–112)
Creatinine, Ser: 0.61 mg/dL (ref 0.50–1.10)
GFR calc Af Amer: >90 mL/min (ref >90)
GFR calc non Af Amer: >90 mL/min (ref >90)
Glucose, Bld: 81 mg/dL (ref 70–99)
POTASSIUM: 4.4 meq/L (ref 3.7–5.3)
Sodium: 135 mEq/L — ABNORMAL LOW (ref 137–147)
Total Bilirubin: 0.5 mg/dL (ref 0.3–1.2)
Total Protein: 6.1 g/dL (ref 6.0–8.3)

## 2014-05-08 LAB — CBC
HEMATOCRIT: 36.4 % (ref 36.0–46.0)
Hemoglobin: 12 g/dL (ref 12.0–15.0)
MCH: 30.2 pg (ref 26.0–34.0)
MCHC: 33 g/dL (ref 30.0–36.0)
MCV: 91.5 fL (ref 78.0–100.0)
PLATELETS: 241 10*3/uL (ref 150–400)
RBC: 3.98 MIL/uL (ref 3.87–5.11)
RDW: 14.3 % (ref 11.5–15.5)
WBC: 8.5 10*3/uL (ref 4.0–10.5)

## 2014-05-08 LAB — URIC ACID: Uric Acid, Serum: 3.2 mg/dL (ref 2.4–7.0)

## 2014-05-08 LAB — LACTATE DEHYDROGENASE: LDH: 205 U/L (ref 94–250)

## 2014-05-08 MED ORDER — LACTATED RINGERS IV SOLN: INTRAVENOUS | Status: DC

## 2014-05-08 MED ORDER — AZATHIOPRINE 50 MG PO TABS
100.0000 mg | ORAL_TABLET | Freq: Every day | ORAL | Status: DC
  Administered 2014-05-08 – 2014-05-10 (×3): 100 mg via ORAL
  Filled 2014-05-08 (×3): qty 2

## 2014-05-08 MED ORDER — OXYCODONE-ACETAMINOPHEN 5-325 MG PO TABS: 1.0000 | ORAL_TABLET | ORAL | Status: DC | PRN

## 2014-05-08 MED ORDER — LACTATED RINGERS IV SOLN: 500.0000 mL | INTRAVENOUS | Status: DC | PRN

## 2014-05-08 MED ORDER — MISOPROSTOL 25 MCG QUARTER TABLET
25.0000 ug | ORAL_TABLET | ORAL | Status: DC | PRN
  Administered 2014-05-08: 25 ug via VAGINAL
  Filled 2014-05-08: qty 0.25

## 2014-05-08 MED ORDER — CITRIC ACID-SODIUM CITRATE 334-500 MG/5ML PO SOLN: 30.0000 mL | ORAL | Status: DC | PRN

## 2014-05-08 MED ORDER — IBUPROFEN 600 MG PO TABS: 600.0000 mg | ORAL_TABLET | Freq: Four times a day (QID) | ORAL | Status: DC | PRN

## 2014-05-08 MED ORDER — LAMOTRIGINE ER 50 MG PO TB24
50.0000 mg | ORAL_TABLET | Freq: Every day | ORAL | Status: DC
  Administered 2014-05-08 – 2014-05-10 (×3): 50 mg via ORAL

## 2014-05-08 MED ORDER — OXYTOCIN BOLUS FROM INFUSION: 500.0000 mL | INTRAVENOUS | Status: DC

## 2014-05-08 MED ORDER — CITRIC ACID-SODIUM CITRATE 334-500 MG/5ML PO SOLN
30.0000 mL | ORAL | Status: DC | PRN
  Administered 2014-05-09: 30 mL via ORAL
  Filled 2014-05-08: qty 15

## 2014-05-08 MED ORDER — ACETAMINOPHEN 325 MG PO TABS
650.0000 mg | ORAL_TABLET | ORAL | Status: DC | PRN
Start: 2014-05-08 — End: 2014-05-09

## 2014-05-08 MED ORDER — LIDOCAINE HCL (PF) 1 % IJ SOLN
30.0000 mL | INTRAMUSCULAR | Status: DC | PRN
  Filled 2014-05-08: qty 30

## 2014-05-08 MED ORDER — OXYTOCIN 40 UNITS IN LACTATED RINGERS INFUSION - SIMPLE MED
62.5000 mL/h | INTRAVENOUS | Status: DC
  Filled 2014-05-08: qty 1000

## 2014-05-08 MED ORDER — ACETAMINOPHEN 325 MG PO TABS: 650.0000 mg | ORAL_TABLET | ORAL | Status: DC | PRN

## 2014-05-08 MED ORDER — AZATHIOPRINE 50 MG PO TABS
50.0000 mg | ORAL_TABLET | Freq: Two times a day (BID) | ORAL | Status: DC
  Filled 2014-05-08 (×2): qty 1

## 2014-05-08 MED ORDER — TERBUTALINE SULFATE 1 MG/ML IJ SOLN: 0.2500 mg | Freq: Once | INTRAMUSCULAR | Status: AC | PRN

## 2014-05-08 MED ORDER — LAMOTRIGINE ER 25 MG PO TB24
50.0000 mg | ORAL_TABLET | Freq: Every day | ORAL | Status: DC
  Filled 2014-05-08: qty 2

## 2014-05-08 MED ORDER — LIDOCAINE HCL (PF) 1 % IJ SOLN: 30.0000 mL | INTRAMUSCULAR | Status: DC | PRN

## 2014-05-08 MED ORDER — LACTATED RINGERS IV SOLN
INTRAVENOUS | Status: DC
  Administered 2014-05-08 – 2014-05-09 (×5): via INTRAVENOUS

## 2014-05-08 MED ORDER — LAMOTRIGINE ER 200 MG PO TB24
1.0000 | ORAL_TABLET | Freq: Every day | ORAL | Status: DC
  Administered 2014-05-08 – 2014-05-10 (×3): 200 mg via ORAL

## 2014-05-08 MED ORDER — ONDANSETRON HCL 4 MG/2ML IJ SOLN
4.0000 mg | Freq: Four times a day (QID) | INTRAMUSCULAR | Status: DC | PRN
  Administered 2014-05-09: 4 mg via INTRAVENOUS
  Filled 2014-05-08: qty 2

## 2014-05-08 MED ORDER — ONDANSETRON HCL 4 MG/2ML IJ SOLN: 4.0000 mg | Freq: Four times a day (QID) | INTRAMUSCULAR | Status: DC | PRN

## 2014-05-08 MED ORDER — OXYTOCIN 40 UNITS IN LACTATED RINGERS INFUSION - SIMPLE MED: 62.5000 mL/h | INTRAVENOUS | Status: DC

## 2014-05-08 NOTE — H&P (Signed)
Desiree Marshall is a 33 y.o. female G2P0101 at 37 weeks 5 days presenting for unscheduled Induction of labor for suspected IHCP.  Maternal Medical History:  Reason for admission: Nausea. Patient admitted for induction of labor for suspicion for intrahepatic cholestasis of pregnancy. She notes increased pruritus of the palms of her hands and soles of her feet consistent with her last pregnancy for which she had IHCP. She also reports increase in uterine ctx, recent decreased FM, no LOF, no vaginal bleeding.   Contractions: Onset was 2 days ago.   Frequency: irregular.   Duration is approximately 60 seconds.   Perceived severity is moderate.    Fetal activity: Perceived fetal activity is decreased.   Last perceived fetal movement was within the past 12 hours.    Prenatal complications: Remote h/o seizures H/o Chrohn's disease Rh negative  Prenatal Complications - Diabetes: none.    OB History   Grav Para Term Preterm Abortions TAB SAB Ect Mult Living   2 1 0 1 0 0 0 0 0 1      Past Medical History  Diagnosis Date  . Crohn's disease   . Astigmatism   . Anemia     Resolved  . Epilepsy     Diagnosed at the age of 34.    Past Surgical History  Procedure Laterality Date  . No past surgeries     Family History: family history includes Asthma in her mother; Cancer (age of onset: 68) in her father; Heart disease in her maternal grandfather, maternal grandmother, and paternal grandfather; Hypertension in her mother; Stroke in her paternal grandmother. Social History:  reports that she has never smoked. She has never used smokeless tobacco. She reports that she does not drink alcohol or use illicit drugs.   Prenatal Transfer Tool  Maternal Diabetes: No Genetic Screening: Normal Maternal Ultrasounds/Referrals: Normal Fetal Ultrasounds or other Referrals:  None Maternal Substance Abuse:  No Significant Maternal Medications:  Meds include: Other:  Lamictal XR azathioprine, PNV, folic  acid Significant Maternal Lab Results:  Lab values include: Group B Strep negative, Rh negative Other Comments:  concern for IHCP  Review of Systems  Constitutional: Negative for fever.  Eyes: Negative for blurred vision.  Cardiovascular: Negative for chest pain.  Gastrointestinal: Negative for nausea and vomiting.  Skin: Positive for itching. Negative for rash.  Neurological: Negative for headaches.      Blood pressure 145/79, pulse 94, temperature 98 F (36.7 C), temperature source Oral, resp. rate 20, height 4' 11"  (1.499 m), weight 73.392 kg (161 lb 12.8 oz). Maternal Exam:  Uterine Assessment: Contraction strength is mild.  Contraction duration is 45 seconds. Contraction frequency is irregular.   Abdomen: Patient reports no abdominal tenderness. Fundal height is 38 cm.   Estimated fetal weight is 2700 grams.   Fetal presentation: vertex  Introitus: Normal vulva. Normal vagina.  Ferning test: not done.  Nitrazine test: not done. Amniotic fluid character: not assessed.  Pelvis: adequate for delivery.   Cervix: Cervix evaluated by digital exam.   By RN: 1cm / long posterior  Fetal Exam Fetal Monitor Review: Mode: hand-held doppler probe.   Baseline rate: 145.  Variability: moderate (6-25 bpm).   Pattern: accelerations present and early decelerations.    Fetal State Assessment: Category I - tracings are normal.     Physical Exam  Nursing note and vitals reviewed. Constitutional: She is oriented to person, place, and time. She appears well-developed and well-nourished.  HENT:  Head: Normocephalic and atraumatic.  Eyes: Pupils are equal, round, and reactive to light.  Neck: Normal range of motion.  Cardiovascular: Normal rate and regular rhythm.   Respiratory: Effort normal.  GI: Soft.  Genitourinary: Vagina normal and uterus normal.  Musculoskeletal: Normal range of motion.  Neurological: She is alert and oriented to person, place, and time.    Prenatal  labs: ABO, Rh:  A neg Antibody:  positive received Rhogam Rubella:  Immune RPR: Nonreactive (02/15 0000)  HBsAg:   NR HIV: Non-reactive (02/15 0000)  GBS: Negative (07/30 0000)   Assessment/Plan: 33 yo G2P0010 at 37 weeks 5 days with pruritus of the palms of her hands and soles of her feet.  She has h/o IHCP with first pregnancy. Patient counseled >30 min about expectant management versus Induction of labor for suspected pathology. We discussed increase in Morbidity and Mortality of fetus with IHCP.  She notes "tracing abnormality" was told to her when she was evaluated in MAU recently. With decrease in FM I recommended We proceed with Induction of labor.  Her BP was noted to be elevated on admission.  Will draw pre-eclampsia labs including protein / creatinine ratio Will include Bile acids, knowing lab will not return most likely until PP We discussed Risk of Early Term RDS vs Risk of IHCP to the fetus Will start misoprostol for cervical ripening.  Epidural on demand Patient with remote h/o seizures so will continue her home meds of lamictal.  Continuous monitoring   Desiree Marshall, Desiree Marshall 05/08/2014, 9:13 PM

## 2014-05-09 ENCOUNTER — Encounter (HOSPITAL_COMMUNITY): Payer: 59 | Admitting: Anesthesiology

## 2014-05-09 ENCOUNTER — Inpatient Hospital Stay (HOSPITAL_COMMUNITY): Payer: 59 | Admitting: Anesthesiology

## 2014-05-09 ENCOUNTER — Encounter (HOSPITAL_COMMUNITY)
Admission: AD | Disposition: A | Discharge status: Home / Self Care | Payer: Self-pay | Source: Ambulatory Visit | Attending: Obstetrics and Gynecology

## 2014-05-09 DIAGNOSIS — Z9889 Other specified postprocedural states: Secondary | ICD-10-CM

## 2014-05-09 LAB — PROTEIN / CREATININE RATIO, URINE
Creatinine, Urine: 58.23 mg/dL
Protein Creatinine Ratio: 0.09 (ref 0.00–0.15)
Total Protein, Urine: 5.4 mg/dL

## 2014-05-09 LAB — RPR

## 2014-05-09 SURGERY — Surgical Case
Anesthesia: Epidural

## 2014-05-09 MED ORDER — NALBUPHINE HCL 10 MG/ML IJ SOLN: 5.0000 mg | INTRAMUSCULAR | Status: DC | PRN

## 2014-05-09 MED ORDER — WITCH HAZEL-GLYCERIN EX PADS: 1.0000 "application " | MEDICATED_PAD | CUTANEOUS | Status: DC | PRN

## 2014-05-09 MED ORDER — OXYTOCIN 10 UNIT/ML IJ SOLN
40.0000 [IU] | INTRAVENOUS | Status: DC | PRN
  Administered 2014-05-09: 40 [IU] via INTRAVENOUS

## 2014-05-09 MED ORDER — KETOROLAC TROMETHAMINE 30 MG/ML IJ SOLN
30.0000 mg | Freq: Four times a day (QID) | INTRAMUSCULAR | Status: AC | PRN
  Administered 2014-05-09 – 2014-05-10 (×3): 30 mg via INTRAVENOUS
  Filled 2014-05-09 (×2): qty 1

## 2014-05-09 MED ORDER — DIPHENHYDRAMINE HCL 25 MG PO CAPS: 25.0000 mg | ORAL_CAPSULE | ORAL | Status: DC | PRN

## 2014-05-09 MED ORDER — SODIUM BICARBONATE 8.4 % IV SOLN
INTRAVENOUS | Status: DC | PRN
  Administered 2014-05-09 (×2): 5 mL via EPIDURAL
  Administered 2014-05-09: 3 mL via EPIDURAL

## 2014-05-09 MED ORDER — BUTORPHANOL TARTRATE 1 MG/ML IJ SOLN
1.0000 mg | INTRAMUSCULAR | Status: DC | PRN
  Administered 2014-05-09 (×2): 1 mg via INTRAVENOUS
  Filled 2014-05-09 (×2): qty 1

## 2014-05-09 MED ORDER — SCOPOLAMINE 1 MG/3DAYS TD PT72
1.0000 | MEDICATED_PATCH | Freq: Once | TRANSDERMAL | Status: DC
  Administered 2014-05-09: 1.5 mg via TRANSDERMAL

## 2014-05-09 MED ORDER — FENTANYL 2.5 MCG/ML BUPIVACAINE 1/10 % EPIDURAL INFUSION (WH - ANES)
14.0000 mL/h | INTRAMUSCULAR | Status: DC | PRN
  Administered 2014-05-09: 14 mL/h via EPIDURAL
  Filled 2014-05-09: qty 125

## 2014-05-09 MED ORDER — ONDANSETRON HCL 4 MG PO TABS: 4.0000 mg | ORAL_TABLET | ORAL | Status: DC | PRN

## 2014-05-09 MED ORDER — OXYCODONE-ACETAMINOPHEN 5-325 MG PO TABS: 1.0000 | ORAL_TABLET | ORAL | Status: DC | PRN

## 2014-05-09 MED ORDER — METOCLOPRAMIDE HCL 5 MG/ML IJ SOLN
10.0000 mg | Freq: Three times a day (TID) | INTRAMUSCULAR | Status: DC | PRN
Start: 2014-05-09 — End: 2014-05-11

## 2014-05-09 MED ORDER — MORPHINE SULFATE (PF) 0.5 MG/ML IJ SOLN
INTRAMUSCULAR | Status: DC | PRN
  Administered 2014-05-09: 3 mg via EPIDURAL

## 2014-05-09 MED ORDER — ONDANSETRON HCL 4 MG/2ML IJ SOLN
INTRAMUSCULAR | Status: AC
  Filled 2014-05-09: qty 4

## 2014-05-09 MED ORDER — PRENATAL MULTIVITAMIN CH
1.0000 | ORAL_TABLET | Freq: Every day | ORAL | Status: DC
  Administered 2014-05-10: 1 via ORAL
  Filled 2014-05-09: qty 1

## 2014-05-09 MED ORDER — LACTATED RINGERS IV SOLN
INTRAVENOUS | Status: DC
  Administered 2014-05-10: via INTRAVENOUS

## 2014-05-09 MED ORDER — ONDANSETRON HCL 4 MG/2ML IJ SOLN: 4.0000 mg | Freq: Three times a day (TID) | INTRAMUSCULAR | Status: DC | PRN

## 2014-05-09 MED ORDER — CEFAZOLIN SODIUM-DEXTROSE 2-3 GM-% IV SOLR
INTRAVENOUS | Status: AC
  Filled 2014-05-09: qty 50

## 2014-05-09 MED ORDER — METHYLERGONOVINE MALEATE 0.2 MG/ML IJ SOLN: 0.2000 mg | INTRAMUSCULAR | Status: DC | PRN

## 2014-05-09 MED ORDER — LIDOCAINE HCL (PF) 1 % IJ SOLN
INTRAMUSCULAR | Status: DC | PRN
  Administered 2014-05-09 (×3): 4 mL

## 2014-05-09 MED ORDER — FAMOTIDINE 20 MG PO TABS
10.0000 mg | ORAL_TABLET | Freq: Two times a day (BID) | ORAL | Status: DC
  Administered 2014-05-09: 10 mg via ORAL
  Administered 2014-05-10 (×2): via ORAL
  Filled 2014-05-09 (×4): qty 1

## 2014-05-09 MED ORDER — ADALIMUMAB 40 MG/0.8ML ~~LOC~~ KIT: 40.0000 mg | PACK | SUBCUTANEOUS | Status: DC

## 2014-05-09 MED ORDER — SIMETHICONE 80 MG PO CHEW
80.0000 mg | CHEWABLE_TABLET | ORAL | Status: DC
  Administered 2014-05-09: 80 mg via ORAL
  Filled 2014-05-09: qty 1

## 2014-05-09 MED ORDER — LANOLIN HYDROUS EX OINT: 1.0000 "application " | TOPICAL_OINTMENT | CUTANEOUS | Status: DC | PRN

## 2014-05-09 MED ORDER — PHENYLEPHRINE 8 MG IN D5W 100 ML (0.08MG/ML) PREMIX OPTIME
INJECTION | INTRAVENOUS | Status: DC | PRN
  Administered 2014-05-09: 60 ug/min via INTRAVENOUS

## 2014-05-09 MED ORDER — CEFAZOLIN SODIUM-DEXTROSE 2-3 GM-% IV SOLR
INTRAVENOUS | Status: DC | PRN
  Administered 2014-05-09: 2 g via INTRAVENOUS

## 2014-05-09 MED ORDER — PHENYLEPHRINE 40 MCG/ML (10ML) SYRINGE FOR IV PUSH (FOR BLOOD PRESSURE SUPPORT)
80.0000 ug | PREFILLED_SYRINGE | INTRAVENOUS | Status: DC | PRN
  Filled 2014-05-09: qty 10

## 2014-05-09 MED ORDER — OXYTOCIN 10 UNIT/ML IJ SOLN
INTRAMUSCULAR | Status: AC
Start: 2014-05-09 — End: 2014-05-09
  Filled 2014-05-09: qty 4

## 2014-05-09 MED ORDER — PHENYLEPHRINE 8 MG IN D5W 100 ML (0.08MG/ML) PREMIX OPTIME
INJECTION | INTRAVENOUS | Status: AC
  Filled 2014-05-09: qty 100

## 2014-05-09 MED ORDER — SODIUM CHLORIDE 0.9 % IJ SOLN: 3.0000 mL | INTRAMUSCULAR | Status: DC | PRN

## 2014-05-09 MED ORDER — SCOPOLAMINE 1 MG/3DAYS TD PT72
MEDICATED_PATCH | TRANSDERMAL | Status: AC
  Filled 2014-05-09: qty 1

## 2014-05-09 MED ORDER — NALOXONE HCL 0.4 MG/ML IJ SOLN: 0.4000 mg | INTRAMUSCULAR | Status: DC | PRN

## 2014-05-09 MED ORDER — ONDANSETRON HCL 4 MG/2ML IJ SOLN: 4.0000 mg | INTRAMUSCULAR | Status: DC | PRN

## 2014-05-09 MED ORDER — PHENYLEPHRINE 40 MCG/ML (10ML) SYRINGE FOR IV PUSH (FOR BLOOD PRESSURE SUPPORT): 80.0000 ug | PREFILLED_SYRINGE | INTRAVENOUS | Status: DC | PRN

## 2014-05-09 MED ORDER — SENNOSIDES-DOCUSATE SODIUM 8.6-50 MG PO TABS
2.0000 | ORAL_TABLET | ORAL | Status: DC
  Administered 2014-05-10: 2 via ORAL
  Filled 2014-05-09 (×2): qty 2

## 2014-05-09 MED ORDER — FLEET ENEMA 7-19 GM/118ML RE ENEM: 1.0000 | ENEMA | Freq: Every day | RECTAL | Status: DC | PRN

## 2014-05-09 MED ORDER — EPHEDRINE 5 MG/ML INJ: 10.0000 mg | INTRAVENOUS | Status: DC | PRN

## 2014-05-09 MED ORDER — MEASLES, MUMPS & RUBELLA VAC ~~LOC~~ INJ: 0.5000 mL | INJECTION | Freq: Once | SUBCUTANEOUS | Status: DC

## 2014-05-09 MED ORDER — METHYLERGONOVINE MALEATE 0.2 MG PO TABS: 0.2000 mg | ORAL_TABLET | ORAL | Status: DC | PRN

## 2014-05-09 MED ORDER — FERROUS SULFATE 325 (65 FE) MG PO TABS
325.0000 mg | ORAL_TABLET | Freq: Two times a day (BID) | ORAL | Status: DC
  Administered 2014-05-10 (×2): 325 mg via ORAL
  Filled 2014-05-09 (×3): qty 1

## 2014-05-09 MED ORDER — DIPHENHYDRAMINE HCL 50 MG/ML IJ SOLN: 12.5000 mg | INTRAMUSCULAR | Status: DC | PRN

## 2014-05-09 MED ORDER — NALOXONE HCL 1 MG/ML IJ SOLN: 1.0000 ug/kg/h | INTRAVENOUS | Status: DC | PRN

## 2014-05-09 MED ORDER — MENTHOL 3 MG MT LOZG: 1.0000 | LOZENGE | OROMUCOSAL | Status: DC | PRN

## 2014-05-09 MED ORDER — DIPHENHYDRAMINE HCL 25 MG PO CAPS: 25.0000 mg | ORAL_CAPSULE | Freq: Four times a day (QID) | ORAL | Status: DC | PRN

## 2014-05-09 MED ORDER — MEPERIDINE HCL 25 MG/ML IJ SOLN
INTRAMUSCULAR | Status: AC
  Administered 2014-05-09: 6.25 mg via INTRAVENOUS
  Filled 2014-05-09: qty 1

## 2014-05-09 MED ORDER — DIPHENHYDRAMINE HCL 50 MG/ML IJ SOLN: 25.0000 mg | INTRAMUSCULAR | Status: DC | PRN

## 2014-05-09 MED ORDER — BISACODYL 10 MG RE SUPP: 10.0000 mg | Freq: Every day | RECTAL | Status: DC | PRN

## 2014-05-09 MED ORDER — FENTANYL CITRATE 0.05 MG/ML IJ SOLN: 25.0000 ug | INTRAMUSCULAR | Status: DC | PRN

## 2014-05-09 MED ORDER — SODIUM CHLORIDE 0.9 % IR SOLN
Status: DC | PRN
  Administered 2014-05-09: 1000 mL

## 2014-05-09 MED ORDER — ONDANSETRON HCL 4 MG/2ML IJ SOLN
INTRAMUSCULAR | Status: DC | PRN
  Administered 2014-05-09: 4 mg via INTRAVENOUS

## 2014-05-09 MED ORDER — KETOROLAC TROMETHAMINE 30 MG/ML IJ SOLN
INTRAMUSCULAR | Status: AC
  Administered 2014-05-09: 30 mg via INTRAVENOUS
  Filled 2014-05-09: qty 1

## 2014-05-09 MED ORDER — LACTATED RINGERS IV SOLN: 500.0000 mL | Freq: Once | INTRAVENOUS | Status: DC

## 2014-05-09 MED ORDER — MORPHINE SULFATE 0.5 MG/ML IJ SOLN
INTRAMUSCULAR | Status: AC
  Filled 2014-05-09: qty 10

## 2014-05-09 MED ORDER — SIMETHICONE 80 MG PO CHEW
80.0000 mg | CHEWABLE_TABLET | Freq: Three times a day (TID) | ORAL | Status: DC
  Administered 2014-05-10 (×3): 80 mg via ORAL
  Filled 2014-05-09 (×4): qty 1

## 2014-05-09 MED ORDER — MEPERIDINE HCL 25 MG/ML IJ SOLN
6.2500 mg | INTRAMUSCULAR | Status: AC | PRN
  Administered 2014-05-09 (×2): 6.25 mg via INTRAVENOUS

## 2014-05-09 MED ORDER — LACTATED RINGERS IV SOLN
INTRAVENOUS | Status: DC | PRN
  Administered 2014-05-09: 14:00:00 via INTRAVENOUS

## 2014-05-09 MED ORDER — KETOROLAC TROMETHAMINE 30 MG/ML IJ SOLN: 30.0000 mg | Freq: Four times a day (QID) | INTRAMUSCULAR | Status: AC | PRN

## 2014-05-09 MED ORDER — ZOLPIDEM TARTRATE 5 MG PO TABS: 5.0000 mg | ORAL_TABLET | Freq: Every evening | ORAL | Status: DC | PRN

## 2014-05-09 MED ORDER — OXYTOCIN 40 UNITS IN LACTATED RINGERS INFUSION - SIMPLE MED: 62.5000 mL/h | INTRAVENOUS | Status: AC

## 2014-05-09 MED ORDER — SIMETHICONE 80 MG PO CHEW: 80.0000 mg | CHEWABLE_TABLET | ORAL | Status: DC | PRN

## 2014-05-09 MED ORDER — DIBUCAINE 1 % RE OINT: 1.0000 "application " | TOPICAL_OINTMENT | RECTAL | Status: DC | PRN

## 2014-05-09 MED ORDER — IBUPROFEN 600 MG PO TABS
600.0000 mg | ORAL_TABLET | Freq: Four times a day (QID) | ORAL | Status: DC
  Administered 2014-05-10 – 2014-05-11 (×4): 600 mg via ORAL
  Filled 2014-05-09 (×4): qty 1

## 2014-05-09 MED ORDER — TETANUS-DIPHTH-ACELL PERTUSSIS 5-2.5-18.5 LF-MCG/0.5 IM SUSP
0.5000 mL | Freq: Once | INTRAMUSCULAR | Status: AC
  Administered 2014-05-10: 0.5 mL via INTRAMUSCULAR

## 2014-05-09 SURGICAL SUPPLY — 36 items
APL SKNCLS STERI-STRIP NONHPOA (GAUZE/BANDAGES/DRESSINGS) ×1
BENZOIN TINCTURE PRP APPL 2/3 (GAUZE/BANDAGES/DRESSINGS) ×3 IMPLANT
BLADE SURG 10 STRL SS (BLADE) ×6 IMPLANT
CLAMP CORD UMBIL (MISCELLANEOUS) IMPLANT
CLOSURE WOUND 1/2 X4 (GAUZE/BANDAGES/DRESSINGS) ×1
CLOTH BEACON ORANGE TIMEOUT ST (SAFETY) ×3 IMPLANT
DRAPE LG THREE QUARTER DISP (DRAPES) IMPLANT
DRSG OPSITE POSTOP 4X10 (GAUZE/BANDAGES/DRESSINGS) ×3 IMPLANT
DURAPREP 26ML APPLICATOR (WOUND CARE) ×3 IMPLANT
ELECT REM PT RETURN 9FT ADLT (ELECTROSURGICAL) ×3
ELECTRODE REM PT RTRN 9FT ADLT (ELECTROSURGICAL) ×1 IMPLANT
EXTRACTOR VACUUM BELL STYLE (SUCTIONS) IMPLANT
GLOVE BIO SURGEON STRL SZ7 (GLOVE) ×3 IMPLANT
GOWN STRL REUS W/TWL LRG LVL3 (GOWN DISPOSABLE) ×6 IMPLANT
KIT ABG SYR 3ML LUER SLIP (SYRINGE) IMPLANT
NDL HYPO 25X5/8 SAFETYGLIDE (NEEDLE) IMPLANT
NEEDLE HYPO 25X5/8 SAFETYGLIDE (NEEDLE) IMPLANT
NS IRRIG 1000ML POUR BTL (IV SOLUTION) ×3 IMPLANT
PACK C SECTION WH (CUSTOM PROCEDURE TRAY) ×3 IMPLANT
PAD OB MATERNITY 4.3X12.25 (PERSONAL CARE ITEMS) ×3 IMPLANT
RETRACTOR WND ALEXIS 25 LRG (MISCELLANEOUS) ×1 IMPLANT
RTRCTR WOUND ALEXIS 25CM LRG (MISCELLANEOUS) ×3
STAPLER VISISTAT 35W (STAPLE) IMPLANT
STRIP CLOSURE SKIN 1/2X4 (GAUZE/BANDAGES/DRESSINGS) ×2 IMPLANT
SUT MNCRL 0 VIOLET CTX 36 (SUTURE) ×2 IMPLANT
SUT MONOCRYL 0 CTX 36 (SUTURE) ×4
SUT PDS AB 0 CTX 60 (SUTURE) IMPLANT
SUT PLAIN 2 0 XLH (SUTURE) IMPLANT
SUT VIC AB 0 CT1 27 (SUTURE) ×6
SUT VIC AB 0 CT1 27XBRD ANBCTR (SUTURE) ×2 IMPLANT
SUT VIC AB 2-0 CT1 27 (SUTURE) ×3
SUT VIC AB 2-0 CT1 TAPERPNT 27 (SUTURE) ×1 IMPLANT
SUT VIC AB 4-0 KS 27 (SUTURE) ×3 IMPLANT
TOWEL OR 17X24 6PK STRL BLUE (TOWEL DISPOSABLE) ×3 IMPLANT
TRAY FOLEY CATH 14FR (SET/KITS/TRAYS/PACK) ×3 IMPLANT
WATER STERILE IRR 1000ML POUR (IV SOLUTION) ×3 IMPLANT

## 2014-05-09 NOTE — Progress Notes (Signed)
Epidural catheter in place on arrival to the PACU from the OR. Epidural catheter removed per anesthesia order with tip intact at 1645   [facility] RN

## 2014-05-09 NOTE — Anesthesia Procedure Notes (Signed)
Epidural Patient location during procedure: OB Start time: 05/09/2014 6:30 AM  Staffing Performed by: anesthesiologist   Preanesthetic Checklist Completed: patient identified, site marked, surgical consent, pre-op evaluation, timeout performed, IV checked, risks and benefits discussed and monitors and equipment checked  Epidural Patient position: sitting Prep: site prepped and draped and DuraPrep Patient monitoring: continuous pulse ox and blood pressure Approach: midline Location: L3-L4 Injection technique: LOR air  Needle:  Needle type: Tuohy  Needle gauge: 17 G Needle length: 9 cm and 9 Needle insertion depth: 4 cm Catheter type: closed end flexible Catheter size: 19 Gauge Catheter at skin depth: 9 cm Test dose: negative  Assessment Events: blood not aspirated, injection not painful, no injection resistance, negative IV test and no paresthesia  Additional Notes Discussed risk of headache, infection, bleeding, nerve injury and failed or incomplete block.  Patient voices understanding and wishes to proceed.  Epidural placed easily on first attempt.  No paresthesia.  Patient tolerated procedure well with no apparent complications.  Jasmine DecemberA> Lydiah Pong, MDReason for block:procedure for pain

## 2014-05-09 NOTE — Brief Op Note (Signed)
05/08/2014 - 05/09/2014  2:28 PM  PATIENT:  Desiree Marshall  33 y.o. female  PRE-OPERATIVE DIAGNOSIS:  cesarean section for non reasurring fetal heart rate    POST-OPERATIVE DIAGNOSIS:  cesarean section for non reasurring fetal heart rate    PROCEDURE:  Procedure(s): CESAREAN SECTION (N/A)  SURGEON:  Surgeon(s) and Role:    * Daria Pastures, MD - Primary  ANESTHESIA:   epidural  EBL:  Total I/O In: 2800 [I.V.:2800] Out: 1400 [Urine:700; Blood:700]  SPECIMEN:  Source of Specimen:  placenta  DISPOSITION OF SPECIMEN:  PATHOLOGY  COUNTS:  YES  TOURNIQUET:  * No tourniquets in log *  DICTATION: .Note written in EPIC  PLAN OF CARE: Admit to inpatient   PATIENT DISPOSITION:  PACU - hemodynamically stable.   Delay start of Pharmacological VTE agent (>24hrs) due to surgical blood loss or risk of bleeding: not applicable  Complications:  none Medications:  Ancef, Pitocin Findings:  Baby female, Apgars 9,9, cord ph P, weight P.   Normal tubes, ovaries and uterus seen.  Baby was skin to skin with mother after birth in the OR.  Technique:  After adequate epidural anesthesia was achieved, the patient was prepped and draped in usual sterile fashion.  A foley catheter was used to drain the bladder.  A pfannanstiel incision was made with the scalpel and carried down to the fascia with the bovie cautery. The fascia was incised in the midline with the scalpel and carried in a transverse curvilinear manner bilaterally.  The fascia was reflected superiorly and inferiorly off the rectus muscles and the muscles split in the midline.  A bowel free portion of the peritoneum was entered bluntly and then extended in a superior and inferior manner with good visualization of the bowel and bladder.  The Alexis instrument was then placed and the vesico-uterine fascia tented up and incised in a transverse curvilinear manner.  A 2 cm transverse incision was made in the upper portion of the lower uterine  segment until the amnion was exposed.   The incision was extended transversely in a blunt manner.  Light mec fluid was noted and the baby delivered in the vertex presentation without complication with reduction of nuchal cord.  The baby was bulb suctioned and the cord was clamped and cut.  The baby was then handed to awaiting Neonatology.  The placenta was then delivered spontaneously and the uterus cleared of all debris.  The uterine incision was then closed with a running lock stitch of 0 monocryl.  An imbricating layer of 0 monocryl was closed as well. Excellent hemostasis of the uterine incision was achieved and the abdomen was cleared with irrigation.  The peritoneum was closed with a running stitch of 2-0 vicryl.  This incorporated the rectus muscles as a separate layer.  The fascia was then closed with a running stitch of 0 vicryl.  The subcutaneous layer was closed with interrupted  stitches of 2-0 plain gut.  The skin was closed with 4-0 vicryl on a Keith needle and steri-strips.  The patient tolerated the procedure well and was returned to the recovery room in stable condition.  All counts were correct times three.  Brycin Kille A

## 2014-05-09 NOTE — Anesthesia Postprocedure Evaluation (Signed)
  Anesthesia Post-op Note  Patient: Desiree Marshall  Procedure(s) Performed: Procedure(s): CESAREAN SECTION (N/A)  Patient Location: PACU  Anesthesia Type:Epidural  Level of Consciousness: awake, alert  and oriented  Airway and Oxygen Therapy: Patient Spontanous Breathing  Post-op Pain: none  Post-op Assessment: Post-op Vital signs reviewed, Patient's Cardiovascular Status Stable, Respiratory Function Stable, Patent Airway, No signs of Nausea or vomiting, Pain level controlled, No headache and No backache  Post-op Vital Signs: Reviewed and stable  Last Vitals:  Filed Vitals:   05/09/14 1600  BP: 119/75  Pulse: 81  Temp:   Resp: 16    Complications: No apparent anesthesia complications

## 2014-05-09 NOTE — Op Note (Signed)
05/08/2014 - 05/09/2014  2:28 PM  PATIENT:  Desiree Marshall  33 y.o. female  PRE-OPERATIVE DIAGNOSIS:  cesarean section for non reasurring fetal heart rate    POST-OPERATIVE DIAGNOSIS:  cesarean section for non reasurring fetal heart rate    PROCEDURE:  Procedure(s): CESAREAN SECTION (N/A)  SURGEON:  Surgeon(s) and Role:    * Daria Pastures, MD - Primary  ANESTHESIA:   epidural  EBL:  Total I/O In: 2800 [I.V.:2800] Out: 1400 [Urine:700; Blood:700]  SPECIMEN:  Source of Specimen:  placenta  DISPOSITION OF SPECIMEN:  PATHOLOGY  COUNTS:  YES  TOURNIQUET:  * No tourniquets in log *  DICTATION: .Note written in EPIC  PLAN OF CARE: Admit to inpatient   PATIENT DISPOSITION:  PACU - hemodynamically stable.   Delay start of Pharmacological VTE agent (>24hrs) due to surgical blood loss or risk of bleeding: not applicable  Complications:  none Medications:  Ancef, Pitocin Findings:  Baby female, Apgars 9,9, cord ph P, weight P.   Normal tubes, ovaries and uterus seen.  Baby was skin to skin with mother after birth in the OR.  Technique:  After adequate epidural anesthesia was achieved, the patient was prepped and draped in usual sterile fashion.  A foley catheter was used to drain the bladder.  A pfannanstiel incision was made with the scalpel and carried down to the fascia with the bovie cautery. The fascia was incised in the midline with the scalpel and carried in a transverse curvilinear manner bilaterally.  The fascia was reflected superiorly and inferiorly off the rectus muscles and the muscles split in the midline.  A bowel free portion of the peritoneum was entered bluntly and then extended in a superior and inferior manner with good visualization of the bowel and bladder.  The Alexis instrument was then placed and the vesico-uterine fascia tented up and incised in a transverse curvilinear manner.  A 2 cm transverse incision was made in the upper portion of the lower uterine  segment until the amnion was exposed.   The incision was extended transversely in a blunt manner.  Light mec fluid was noted and the baby delivered in the vertex presentation without complication with reduction of nuchal cord.  The baby was bulb suctioned and the cord was clamped and cut.  The baby was then handed to awaiting Neonatology.  The placenta was then delivered spontaneously and the uterus cleared of all debris.  The uterine incision was then closed with a running lock stitch of 0 monocryl.  An imbricating layer of 0 monocryl was closed as well. Excellent hemostasis of the uterine incision was achieved after two additional figure of eight stitches placed in R corner where a small artery was bleeding and the abdomen was cleared with irrigation.  The peritoneum was closed with a running stitch of 2-0 vicryl.  This incorporated the rectus muscles as a separate layer.  The fascia was then closed with a running stitch of 0 vicryl.  The subcutaneous layer was closed with interrupted  stitches of 2-0 plain gut.  The skin was closed with 4-0 vicryl on a Keith needle and steri-strips.  The patient tolerated the procedure well and was returned to the recovery room in stable condition.  All counts were correct times three.  Naheem Mosco A

## 2014-05-09 NOTE — Anesthesia Preprocedure Evaluation (Addendum)
Anesthesia Evaluation  Patient identified by MRN, date of birth, ID band Patient awake    Reviewed: Allergy & Precautions, H&P , NPO status , Patient's Chart, lab work & pertinent test results, reviewed documented beta blocker date and time   History of Anesthesia Complications Negative for: history of anesthetic complications  Airway Mallampati: I TM Distance: >3 FB Neck ROM: full    Dental  (+) Teeth Intact   Pulmonary neg pulmonary ROS,  breath sounds clear to auscultation        Cardiovascular negative cardio ROS  Rhythm:regular Rate:Normal     Neuro/Psych Seizures - (no seizure since 2009), Well Controlled,  negative psych ROS   GI/Hepatic Neg liver ROS, GERD-  Medicated,Crohn's disease Cholestasis of pregnancy   Endo/Other  negative endocrine ROS  Renal/GU negative Renal ROS     Musculoskeletal   Abdominal   Peds  Hematology negative hematology ROS (+)   Anesthesia Other Findings   Reproductive/Obstetrics (+) Pregnancy                           Anesthesia Physical Anesthesia Plan  ASA: II and emergent  Anesthesia Plan: Epidural   Post-op Pain Management:    Induction:   Airway Management Planned:   Additional Equipment:   Intra-op Plan:   Post-operative Plan:   Informed Consent: I have reviewed the patients History and Physical, chart, labs and discussed the procedure including the risks, benefits and alternatives for the proposed anesthesia with the patient or authorized representative who has indicated his/her understanding and acceptance.     Plan Discussed with: CRNA, Anesthesiologist and Surgeon  Anesthesia Plan Comments: (Patient for urgent C/Section for non reassuring fetal heart rate tracing and remote from vaginal delivery. Will use epidural for C/Section. )       Anesthesia Quick Evaluation

## 2014-05-09 NOTE — Progress Notes (Signed)
Pt doing better with epidural.  FHTs 120s, g STV, NST R, occ mild variable.   Tod q 2-3 SVE deferred- SROM this am.  Filed Vitals:   05/09/14 0706 05/09/14 0711 05/09/14 0712 05/09/14 0732  BP: 122/80  124/76 117/79  Pulse: 72 70 74 89  Temp:      TempSrc:      Resp: 20  18 18   Height:      Weight:      SpO2: 97%  96%    BPs stable without signs of preeclampsia.  LFTs were normal.  Induction with cytotec- continue.  Limit vaginal exams for pt's comfort.

## 2014-05-09 NOTE — Progress Notes (Addendum)
Pt reported to be complete and I had pt push for a couple contractions.  Presentation -3 and did not come down.  Light mec fluid seen.  Posterior cervix felt like a lip that would not be reduced and baby confirmed OA.  FHTs 140s to 150s, good STV, good accels but for last 45 min have had late decels despite repositioning.  Cervix and presentation unchanged- for C/S for late decels remote from vaginal delivery.  All r/b/alt d/w pt and she agrees to proceed.

## 2014-05-09 NOTE — Transfer of Care (Signed)
Immediate Anesthesia Transfer of Care Note  Patient: Desiree Marshall  Procedure(s) Performed: Procedure(s): CESAREAN SECTION (N/A)  Patient Location: PACU  Anesthesia Type:Epidural  Level of Consciousness: awake, alert  and oriented  Airway & Oxygen Therapy: Patient Spontanous Breathing  Post-op Assessment: Report given to PACU RN and Post -op Vital signs reviewed and stable  Post vital signs: Reviewed and stable  Complications: No apparent anesthesia complications

## 2014-05-10 ENCOUNTER — Encounter (HOSPITAL_COMMUNITY): Payer: Self-pay | Admitting: Obstetrics and Gynecology

## 2014-05-10 LAB — CBC
HCT: 30.1 % — ABNORMAL LOW (ref 36.0–46.0)
HEMOGLOBIN: 9.8 g/dL — AB (ref 12.0–15.0)
MCH: 30 pg (ref 26.0–34.0)
MCHC: 32.6 g/dL (ref 30.0–36.0)
MCV: 92 fL (ref 78.0–100.0)
Platelets: 193 10*3/uL (ref 150–400)
RBC: 3.27 MIL/uL — ABNORMAL LOW (ref 3.87–5.11)
RDW: 14.3 % (ref 11.5–15.5)
WBC: 14.8 10*3/uL — ABNORMAL HIGH (ref 4.0–10.5)

## 2014-05-10 LAB — BILE ACIDS, TOTAL: Bile Acids Total: 178 umol/L — ABNORMAL HIGH (ref 0–19)

## 2014-05-10 NOTE — Progress Notes (Signed)
Patient is eating, ambulating, voiding.  Pain control is good.   Appropriate lochia.  No complaints.  Filed Vitals:   05/10/14 0001 05/10/14 0400 05/10/14 0800 05/10/14 1350  BP: 111/65 111/67 107/70 114/68  Pulse: 91 77 77 80  Temp: 98.2 F (36.8 C) 98 F (36.7 C) 98.4 F (36.9 C) 98.1 F (36.7 C)  TempSrc:      Resp: 18 18 22 20   Height:      Weight:      SpO2: 94% 95% 94% 96%    Fundus firm Perineum without swelling. No CT  Lab Results  Component Value Date   WBC 14.8* 05/10/2014   HGB 9.8* 05/10/2014   HCT 30.1* 05/10/2014   MCV 92.0 05/10/2014   PLT 193 05/10/2014      A/P Post op day #1 s/p c/s for NRFHT. Anemia: Hb 9.8 - daily iron  Routine care.  Expect d/c tomorrow. Circ desired, consent obtained.    Allyn Kenner

## 2014-05-11 MED ORDER — OXYCODONE-ACETAMINOPHEN 5-325 MG PO TABS: 1.0000 | ORAL_TABLET | ORAL | Status: DC | PRN

## 2014-05-11 NOTE — Anesthesia Postprocedure Evaluation (Signed)
  Anesthesia Post-op Note  Patient: Desiree Marshall  Procedure(s) Performed: Procedure(s): CESAREAN SECTION (N/A)  Patient Location: Mother/Baby  Anesthesia Type:Epidural  Level of Consciousness: awake  Airway and Oxygen Therapy: Patient Spontanous Breathing  Post-op Pain: none  Post-op Assessment: Post-op Vital signs reviewed, Patient's Cardiovascular Status Stable, Respiratory Function Stable, Patent Airway, No signs of Nausea or vomiting, Adequate PO intake, Pain level controlled, No headache, No backache, No residual numbness and No residual motor weakness  Post-op Vital Signs: Reviewed and stable  Last Vitals:  Filed Vitals:   05/11/14 0548  BP: 105/70  Pulse: 78  Temp: 36.4 C  Resp: 20    Complications: No apparent anesthesia complications

## 2014-05-11 NOTE — Progress Notes (Signed)
  Patient is eating, ambulating, voiding.  Pain control is good.  Filed Vitals:   05/10/14 0800 05/10/14 1350 05/10/14 1845 05/11/14 0548  BP: 107/70 114/68 107/69 105/70  Pulse: 77 80 86 78  Temp: 98.4 F (36.9 C) 98.1 F (36.7 C) 98.2 F (36.8 C) 97.6 F (36.4 C)  TempSrc:    Oral  Resp: 22 20 20 20   Height:      Weight:      SpO2: 94% 96% 98%     lungs:   clear to auscultation cor:    RRR Abdomen:  soft, appropriate tenderness, incisions intact and without erythema or exudate ex:    no cords   Lab Results  Component Value Date   WBC 14.8* 05/10/2014   HGB 9.8* 05/10/2014   HCT 30.1* 05/10/2014   MCV 92.0 05/10/2014   PLT 193 05/10/2014     A neg/RI  A/P    Post operative day 2.  Routine post op and postpartum care.  Expect d/c today.  Baby A neg- no Rhogam.  Percocet for pain control.

## 2014-05-11 NOTE — Discharge Summary (Signed)
Obstetric Discharge Summary Reason for Admission: induction of labor and intrahepatic cholestasis of pregnancy at term Prenatal Procedures: NST Intrapartum Procedures: cesarean: low cervical, transverse Postpartum Procedures: none Complications-Operative and Postpartum: none Hemoglobin  Date Value Ref Range Status  05/10/2014 9.8* 12.0 - 15.0 g/dL Final     DELTA CHECK NOTED     REPEATED TO VERIFY     HCT  Date Value Ref Range Status  05/10/2014 30.1* 36.0 - 46.0 % Final     Discharge Diagnoses: Term Pregnancy-delivered  Discharge Information: Date: 05/11/2014 Activity: pelvic rest Diet: routine Medications: Iron and Percocet Condition: stable Instructions: refer to practice specific booklet Discharge to: home Follow-up Information   Follow up with Milinda Sweeney A, MD In 4 weeks.   Specialty:  Obstetrics and Gynecology   Contact information:   [address] [place] [address] [phone]       Newborn Data: Live born female  Birth Weight: 7 lb 7.8 oz (3395 g) APGAR: 9, 9  Home with mother.  Brielle Moro A 05/11/2014, 7:59 AM

## 2014-05-11 NOTE — Addendum Note (Signed)
Addendum created 05/11/14 0810 by Suella Grove, CRNA   Modules edited: Notes Section   Notes Section:  File: [id number]

## 2014-05-12 ENCOUNTER — Telehealth: Payer: Self-pay | Admitting: Neurology

## 2014-05-12 NOTE — Telephone Encounter (Signed)
Pt questioning if she should decrease LAMICTAL XR 200 MG TB24 back to original dose.  Thanks

## 2014-05-12 NOTE — Telephone Encounter (Signed)
I called the patient back.  She said the hospital did not do labs for Lamictal level.  She indicates they told her to call our office.  She would like to know if an order could be placed for her to get labs drawn at [facility] in [place] since that location is more convenient.

## 2014-05-12 NOTE — Telephone Encounter (Signed)
OK with me. Please put in order

## 2014-05-12 NOTE — Telephone Encounter (Signed)
Patient is currently taking Lamictal XR 250mg  daily.  She recently gave birth and would like to know if her dose needs to be decreased to 200mg  daily or if she needs to remain on 250mg  daily.   Patient has appt scheduled with CM in Oct.  (Last seen by Dr Terrace Arabia in June, previous appt was with CM in March)

## 2014-05-12 NOTE — Telephone Encounter (Signed)
Patient delivered baby on Tuesday and questioning if she should increase her LAMICTAL XR 200 MG TB24 back to original dose prior to pregnancy?  Please return call anytime and leave message if not available.

## 2014-05-15 ENCOUNTER — Other Ambulatory Visit: Payer: Self-pay | Admitting: Neurology

## 2014-05-15 DIAGNOSIS — R569 Unspecified convulsions: Secondary | ICD-10-CM

## 2014-05-15 NOTE — Telephone Encounter (Signed)
Left message that order has been place for Lamictal level to be done at [facility].   I can fax the order to them if needed.  Asked to call back if she has further questions.

## 2014-05-18 ENCOUNTER — Telehealth: Payer: Self-pay | Admitting: Nurse Practitioner

## 2014-05-18 NOTE — Telephone Encounter (Signed)
Order has been faxed to [phone] and confirmation received.  Also spoke to patient and relayed that she call to make sure they have received the order.

## 2014-05-18 NOTE — Telephone Encounter (Signed)
Lisa from [facility] calling to say that the patient is there and they have no orders for labwork. Please return call and advise.

## 2014-05-19 ENCOUNTER — Other Ambulatory Visit: Payer: Self-pay | Admitting: Nurse Practitioner

## 2014-05-22 ENCOUNTER — Telehealth: Payer: Self-pay | Admitting: Nurse Practitioner

## 2014-05-22 LAB — LAMOTRIGINE LEVEL: Lamotrigine Lvl: 3.1 ug/mL (ref 2.0–20.0)

## 2014-05-22 NOTE — Telephone Encounter (Signed)
Called patient with Lamictal level.  She can reduce Lamictal to 226m XR daily. She does not need refills. Repeat Lamictal level in 10 days. [facility]

## 2014-06-06 ENCOUNTER — Encounter: Payer: Self-pay | Admitting: *Deleted

## 2014-06-07 ENCOUNTER — Telehealth: Payer: Self-pay | Admitting: Neurology

## 2014-06-07 LAB — LAMOTRIGINE LEVEL: Lamotrigine Lvl: 5.4 ug/mL (ref 2.0–20.0)

## 2014-06-07 NOTE — Telephone Encounter (Signed)
Message copied by Levert Feinstein on Wed Jun 07, 2014  2:29 PM ------      Message from: Ardeth Sportsman      Created: Wed Jun 07, 2014  8:53 AM      Regarding: Labs        Please review patient labs, C.Martin on vacation.       ----- Message -----         From: [facility] Lab Results In Interface         Sent: 06/07/2014   5:43 AM           To: Nilda Riggs, NP                   ------

## 2014-06-07 NOTE — Telephone Encounter (Signed)
Desiree Marshall:   please call patient  I failed to reach patient, please call her, Lamictal level was 5.4, within therapeutic range, if she is doing well with current medications, no recurrent seizure, she should continue current dose of Lamictal

## 2014-06-08 ENCOUNTER — Telehealth: Payer: Self-pay | Admitting: *Deleted

## 2014-06-08 ENCOUNTER — Other Ambulatory Visit: Payer: Self-pay | Admitting: Obstetrics and Gynecology

## 2014-06-08 NOTE — Telephone Encounter (Signed)
Left message with Lamictal level at 5.4, therapeutic range, and if she is doing well and no recurrent seizure, she should continue current dose of Lamictal, per Dr. Terrace Arabia.  Told her to call with any questions.

## 2014-06-08 NOTE — Telephone Encounter (Signed)
Left patient a message that her appointment needs to be rescheduled.

## 2014-06-09 LAB — CYTOLOGY - PAP

## 2014-06-29 ENCOUNTER — Ambulatory Visit: Payer: Self-pay | Admitting: Adult Health

## 2014-07-31 ENCOUNTER — Encounter (HOSPITAL_COMMUNITY): Payer: Self-pay | Admitting: Obstetrics and Gynecology

## 2014-08-31 ENCOUNTER — Ambulatory Visit (INDEPENDENT_AMBULATORY_CARE_PROVIDER_SITE_OTHER): Payer: 59 | Admitting: Nurse Practitioner

## 2014-08-31 ENCOUNTER — Encounter: Payer: Self-pay | Admitting: Nurse Practitioner

## 2014-08-31 VITALS — BP 131/82 | HR 98 | Temp 97.6°F | Ht 59.0 in | Wt 140.0 lb

## 2014-08-31 DIAGNOSIS — G40309 Generalized idiopathic epilepsy and epileptic syndromes, not intractable, without status epilepticus: Secondary | ICD-10-CM

## 2014-08-31 DIAGNOSIS — G40209 Localization-related (focal) (partial) symptomatic epilepsy and epileptic syndromes with complex partial seizures, not intractable, without status epilepticus: Secondary | ICD-10-CM

## 2014-08-31 MED ORDER — LAMICTAL XR 200 MG PO TB24: 1.0000 | ORAL_TABLET | Freq: Every day | ORAL | Status: DC

## 2014-08-31 NOTE — Progress Notes (Signed)
[facility]  PATIENT: Desiree Marshall DOB: 04/30/1981   REASON FOR VISIT: follow-up for seizure disorder   HISTORY OF PRESENT ILLNESS:Desiree Marshall, 33 year old female returns for followup. She has a history of seizure disorder,she recently  delivered a healthy baby boy,  this is her second pregnancy. She was last seen in the office by Dr. Terrace ArabiaYan, 03/24/2014.  Her last seizure was in 2009. Her last Lamictal level was 5.4 06/02/2014 and her dose was reduced from brand Lamictal XR 250, qhs, to Lamictal 200 XR Brand every hs. She also has a history of Crohn's disease and [facility]. She returns for reevaluation.   HISTORY: She has history of seizures since she was a teenager, partial complex seizures, she would have aura of speech difficulty, confused, hot before she loss of consciousness, going into convulsion. All the recurrent seizure is due to medications changes or missing dose. She totally had 3 seizures in her life time. She reported previously normal MRI of the brain, but abnormal EEG, I do not have detailed report anymore. Previous pregnancy was successful while taking brand-name Lamictal, She delivered baby boy on 04/18/12. She used to take brand-name Lamictal xr 250 mg every night, only during recent visit in March 2015, it was increased to 250, based on the blood levels, REVIEW OF SYSTEMS: Full 14 system review of systems performed and notable only for those listed, all others are neg:  Constitutional: N/A  Cardiovascular: N/A  Ear/Nose/Throat: N/A  Skin: N/A  Eyes: N/A  Respiratory: N/A  Gastroitestinal: N/A  Hematology/Lymphatic: N/A  Endocrine: N/A Musculoskeletal:N/A  Allergy/Immunology: N/A  Neurological: occasional headache Psychiatric: depression Sleep : NA   ALLERGIES: Allergies  Allergen Reactions  . Promethazine Hcl Other (See Comments)    Pt states that this medication causes hallucinations.     HOME MEDICATIONS: Outpatient  Prescriptions Prior to Visit  Medication Sig Dispense Refill  . acetaminophen (TYLENOL) 500 MG tablet Take 1,000 mg by mouth every 6 (six) hours as needed for headache.    . adalimumab (HUMIRA) 40 MG/0.8ML injection Inject 40 mg into the skin every 14 (fourteen) days.     Marland Kitchen. azaTHIOprine (IMURAN) 50 MG tablet Take 100 mg by mouth at bedtime.     . calcium carbonate (TUMS - DOSED IN MG ELEMENTAL CALCIUM) 500 MG chewable tablet Chew 1 tablet by mouth daily as needed for indigestion or heartburn.    Marland Kitchen. LAMICTAL XR 200 MG TB24 TAKE 1 TABLET DAILY 90 tablet 3  . cholecalciferol (VITAMIN D) 1000 UNITS tablet Take 2,000 Units by mouth daily.     . folic acid (FOLVITE) 400 MCG tablet Take 3,200 mcg by mouth daily.     Marland Kitchen. LAMICTAL XR 50 MG TB24 Take 1 tablet (50 mg total) by mouth daily. BRAND ONLY (Patient not taking: Reported on 08/31/2014) 90 tablet 1  . oxyCODONE-acetaminophen (PERCOCET/ROXICET) 5-325 MG per tablet Take 1-2 tablets by mouth every 4 (four) hours as needed for severe pain (moderate - severe pain). (Patient not taking: Reported on 08/31/2014) 30 tablet 0  . Prenat w/o A-FeCbn-Meth-FA-DHA (PRENATE MINI) 29-0.6-0.4-350 MG CAPS Take 1 tablet by mouth daily.     . ranitidine (ZANTAC) 75 MG tablet Take 75 mg by mouth 2 (two) times daily.     No facility-administered medications prior to visit.    PAST MEDICAL HISTORY: Past Medical History  Diagnosis Date  . Crohn's disease   . Astigmatism   . Anemia     Resolved  .  Epilepsy     Diagnosed at the age of 59.     PAST SURGICAL HISTORY: Past Surgical History  Procedure Laterality Date  . No past surgeries    . Cesarean section N/A 05/09/2014    Procedure: CESAREAN SECTION;  Surgeon: Loney Laurence, MD;  Location: [facility];  Service: Obstetrics;  Laterality: N/A;    FAMILY HISTORY: Family History  Problem Relation Age of Onset  . Asthma Mother   . Hypertension Mother   . Cancer Father 48    Prostate Cancer  . Heart disease  Maternal Grandmother   . Heart disease Maternal Grandfather   . Stroke Paternal Grandmother   . Heart disease Paternal Grandfather     SOCIAL HISTORY: History   Social History  . Marital Status: Married    Spouse Name: Daron Offer     Number of Children: 1  . Years of Education: 0   Occupational History  . HOMEMAKER     Social History Main Topics  . Smoking status: Never Smoker   . Smokeless tobacco: Never Used  . Alcohol Use: No  . Drug Use: No  . Sexual Activity:    Partners: Male    Birth Control/ Protection: None   Other Topics Concern  . Not on file   Social History Narrative   Marital Status:  Married Estate agent)    Children:  Son Corporate treasurer)    Pets: Dog (1)    Living Situation: Lives with husband and son   Occupation: Futures trader    Education: Manufacturing engineer in Nursing Administration       Tobacco Use/Exposure:  None    Alcohol Use:  Occasional   Drug Use:  None   Diet:  Regular   Exercise:  Walking - once weekly    Hobbies: Baking - Sewing                  PHYSICAL EXAM  Filed Vitals:   08/31/14 1447  BP: 131/82  Pulse: 98  Temp: 97.6 F (36.4 C)  TempSrc: Oral  Height: 4\' 11"  (1.499 m)  Weight: 140 lb (63.504 kg)   Body mass index is 28.26 kg/(m^2). Generalized: Well developed, in no acute distress  Head: normocephalic and atraumatic,. Oropharynx benign  Neck: Supple, no carotid bruits  Cardiac: Regular rate rhythm, no murmur  Musculoskeletal: No deformity   Neurological examination   Mentation: Alert oriented to time, place, history taking. Follows all commands speech and language fluent  Cranial nerve II-XII: Pupils were equal round reactive to light extraocular movements were full, visual field were full on confrontational test. Facial sensation and strength were normal. hearing was intact to finger rubbing bilaterally. Uvula tongue midline. head turning and shoulder shrug were normal and symmetric.Tongue protrusion into cheek strength was  normal. Motor: normal bulk and tone, full strength in the BUE, BLE, No focal weakness Coordination: finger-nose-finger, heel-to-shin bilaterally, no dysmetria Reflexes: Brachioradialis 2/2, biceps 2/2, triceps 2/2, patellar 2/2, Achilles 2/2, plantar responses were flexor bilaterally. Gait and Station: Rising up from seated position without assistance, normal stance, moderate stride, good arm swing, smooth turning, able to perform tiptoe, and heel walking without difficulty. Tandem gait is steady  DIAGNOSTIC DATA (LABS, IMAGING, TESTING) - I reviewed patient records, labs, notes, testing and imaging myself where available.  Lab Results  Component Value Date   WBC 14.8* 05/10/2014   HGB 9.8* 05/10/2014   HCT 30.1* 05/10/2014   MCV 92.0 05/10/2014   PLT 193 05/10/2014  Component Value Date/Time   NA 135* 05/08/2014 2115   NA 139 06/22/2013 0939   K 4.4 05/08/2014 2115   CL 101 05/08/2014 2115   CO2 22 05/08/2014 2115   GLUCOSE 81 05/08/2014 2115   GLUCOSE 86 06/22/2013 0939   BUN 8 05/08/2014 2115   BUN 10 06/22/2013 0939   CREATININE 0.61 05/08/2014 2115   CALCIUM 9.3 05/08/2014 2115   PROT 6.1 05/08/2014 2115   PROT 7.0 06/22/2013 0939   ALBUMIN 2.5* 05/08/2014 2115   AST 23 05/08/2014 2115   ALT 19 05/08/2014 2115   ALKPHOS 185* 05/08/2014 2115   BILITOT 0.5 05/08/2014 2115   GFRNONAA >90 05/08/2014 2115   GFRAA >90 05/08/2014 2115       ASSESSMENT AND PLAN  33 y.o. year old female  has a past medical history of Crohn's disease; Astigmatism; Anemia; and Epilepsy. here to follow up. Most recent Lamictal level 5.4 on 06/02/2014. Lamictal dose reduced to 200 mg XR daily brand  Continue Lamictal 200 mg XR BRAND daily will refill mail order Follow-up yearly and when necessary Nilda Riggs, [facility], [facility], APRN  [facility] [address] [place], [place] [address] [phone]

## 2014-08-31 NOTE — Patient Instructions (Signed)
Continue Lamictal 200 mg daily will refill mail order Follow-up yearly and when necessary

## 2014-09-04 NOTE — Progress Notes (Signed)
I agree above plan. 

## 2015-01-10 ENCOUNTER — Other Ambulatory Visit: Payer: Self-pay | Admitting: Nurse Practitioner

## 2015-01-11 NOTE — Telephone Encounter (Signed)
Last OV note says: Continue Lamictal 200 mg XR BRAND daily will refill mail order

## 2015-09-06 ENCOUNTER — Ambulatory Visit (INDEPENDENT_AMBULATORY_CARE_PROVIDER_SITE_OTHER): Payer: Commercial Managed Care - HMO | Admitting: Nurse Practitioner

## 2015-09-06 ENCOUNTER — Encounter: Payer: Self-pay | Admitting: Nurse Practitioner

## 2015-09-06 VITALS — BP 107/67 | HR 91 | Ht 59.0 in | Wt 117.0 lb

## 2015-09-06 DIAGNOSIS — G40209 Localization-related (focal) (partial) symptomatic epilepsy and epileptic syndromes with complex partial seizures, not intractable, without status epilepticus: Secondary | ICD-10-CM

## 2015-09-06 DIAGNOSIS — G40309 Generalized idiopathic epilepsy and epileptic syndromes, not intractable, without status epilepticus: Secondary | ICD-10-CM

## 2015-09-06 MED ORDER — LAMICTAL XR 200 MG PO TB24: 1.0000 | ORAL_TABLET | Freq: Every day | ORAL | Status: DC

## 2015-09-06 NOTE — Progress Notes (Signed)
[facility]  PATIENT: Desiree Marshall DOB: 1980/10/17   REASON FOR VISIT: Follow-up for epilepsy HISTORY FROM: Patient    HISTORY OF PRESENT ILLNESS:Desiree Marshall, 34 year old female returns for followup. She has a history of seizure disorder, last seen in the office 08/31/2014. Her last seizure was in 2009. Her last Lamictal level was 5.4 06/02/2014 after delivery of baby boy and her dose was reduced from brand Lamictal XR 250, qhs, to Lamictal 200 XR Brand every hs. She also has a history of Crohn's disease and is followed at [facility]. She recently had a flare of her Crohn's .She has recently been placed on Imitrex for headaches by her primary care. She returns for reevaluation.   HISTORY: She has history of seizures since she was a teenager, partial complex seizures, she would have aura of speech difficulty, confused, hot before she loss of consciousness, going into convulsion. All the recurrent seizure is due to medications changes or missing dose. She totally had 3 seizures in her life time. She reported previously normal MRI of the brain, but abnormal EEG, I do not have detailed report anymore. Previous pregnancy was successful while taking brand-name Lamictal, She delivered baby boy on 04/18/12. She used to take brand-name Lamictal xr 250 mg every night, only during recent visit in March 2015, it was increased to 250, based on the blood levels,   REVIEW OF SYSTEMS: Full 14 system review of systems performed and notable only for those listed, all others are neg:  Constitutional: neg  Cardiovascular: neg Ear/Nose/Throat: neg  Skin: neg Eyes: neg Respiratory: neg Gastroitestinal: neg  Hematology/Lymphatic: neg  Endocrine: neg Musculoskeletal:neg Allergy/Immunology: neg Neurological:  Headache, history of seizure disorder Psychiatric: neg Sleep : neg   ALLERGIES: Allergies  Allergen Reactions  . Promethazine Hcl Other (See Comments)    Pt states that this  medication causes hallucinations.     HOME MEDICATIONS: Outpatient Prescriptions Prior to Visit  Medication Sig Dispense Refill  . acetaminophen (TYLENOL) 500 MG tablet Take 1,000 mg by mouth every 6 (six) hours as needed for headache.    . adalimumab (HUMIRA) 40 MG/0.8ML injection Inject 40 mg into the skin every 7 (seven) days.     Marland Kitchen azaTHIOprine (IMURAN) 50 MG tablet Take 100 mg by mouth at bedtime.     . calcium carbonate (TUMS - DOSED IN MG ELEMENTAL CALCIUM) 500 MG chewable tablet Chew 1 tablet by mouth daily as needed for indigestion or heartburn.    . cholecalciferol (VITAMIN D) 1000 UNITS tablet Take 2,000 Units by mouth daily.     . folic acid (FOLVITE) 875 MCG tablet Take 3,200 mcg by mouth daily.     Marland Kitchen LAMICTAL XR 200 MG TB24 Take 1 tablet (200 mg total) by mouth daily. 90 tablet 3  . Prenat w/o A-FeCbn-Meth-FA-DHA (PRENATE MINI) 29-0.6-0.4-350 MG CAPS Take 1 tablet by mouth daily.     . sertraline (ZOLOFT) 50 MG tablet 75 mg.     . ranitidine (ZANTAC) 75 MG tablet Take 75 mg by mouth 2 (two) times daily.     No facility-administered medications prior to visit.    PAST MEDICAL HISTORY: Past Medical History  Diagnosis Date  . Crohn's disease ([place])   . Astigmatism   . Anemia     Resolved  . Epilepsy ([place])     Diagnosed at the age of 33.     PAST SURGICAL HISTORY: Past Surgical History  Procedure Laterality Date  . No past surgeries    .  Cesarean section N/A 05/09/2014    Procedure: CESAREAN SECTION;  Surgeon: Daria Pastures, MD;  Location: [facility];  Service: Obstetrics;  Laterality: N/A;    FAMILY HISTORY: Family History  Problem Relation Age of Onset  . Asthma Mother   . Hypertension Mother   . Cancer Father 62    Prostate Cancer  . Heart disease Maternal Grandmother   . Heart disease Maternal Grandfather   . Stroke Paternal Grandmother   . Heart disease Paternal Grandfather     SOCIAL HISTORY: Social History   Social History  . Marital Status:  Married    Spouse Name: Ozzie Hoyle   . Number of Children: 1  . Years of Education: 0   Occupational History  . HOMEMAKER     Social History Main Topics  . Smoking status: Never Smoker   . Smokeless tobacco: Never Used  . Alcohol Use: No  . Drug Use: No  . Sexual Activity:    Partners: Male    Birth Control/ Protection: None   Other Topics Concern  . Not on file   Social History Narrative   Marital Status:  Married Therapist, art)    Children:  Son Banker)    Pets: Dog (1)    Living Situation: Lives with husband and son   Occupation: Agricultural engineer    Education: Conservator, museum/gallery in [place] Use/Exposure:  None    Alcohol Use:  Occasional   Drug Use:  None   Diet:  Regular   Exercise:  Walking - once weekly    Hobbies: Baking - Sewing                  PHYSICAL EXAM  Filed Vitals:   09/06/15 1538  BP: 107/67  Pulse: 91  Height: 4' 11"  (1.499 m)  Weight: 117 lb (53.071 kg)   Body mass index is 23.62 kg/(m^2). Generalized: Well developed, in no acute distress  Head: normocephalic and atraumatic,. Oropharynx benign  Neck: Supple, no carotid bruits  Cardiac: Regular rate rhythm, no murmur  Musculoskeletal: No deformity   Neurological examination   Mentation: Alert oriented to time, place, history taking. Follows all commands speech and language fluent  Cranial nerve II-XII: Pupils were equal round reactive to light extraocular movements were full, visual field were full on confrontational test. Facial sensation and strength were normal. hearing was intact to finger rubbing bilaterally. Uvula tongue midline. head turning and shoulder shrug were normal and symmetric.Tongue protrusion into cheek strength was normal. Motor: normal bulk and tone, full strength in the BUE, BLE, No focal weakness Coordination: finger-nose-finger, heel-to-shin bilaterally, no dysmetria Reflexes: Brachioradialis 2/2, biceps 2/2, triceps 2/2, patellar 2/2, Achilles 2/2,  plantar responses were flexor bilaterally. Gait and Station: Rising up from seated position without assistance, normal stance, moderate stride, good arm swing, smooth turning, able to perform tiptoe, and heel walking without difficulty. Tandem gait is steady  DIAGNOSTIC DATA (LABS, IMAGING, TESTING) -   ASSESSMENT AND PLAN 34 y.o. year old female has a past medical history of Crohn's disease;  Anemia; and Epilepsy. here to follow up.   PLAN: Continue Lamictal 200 mg XR BRAND daily will refill mail order Call for any seizure activity  Follow-up yearly and when necessary Dennie Bible, [facility], [facility], [facility] [address] [place], [place] [address] [phone]

## 2015-09-06 NOTE — Patient Instructions (Signed)
Continue Lamictal at current dose BRAND drug F/U yearly and prn

## 2015-09-07 NOTE — Progress Notes (Signed)
I have reviewed and agreed above plan. 

## 2015-10-16 ENCOUNTER — Telehealth: Payer: Self-pay | Admitting: Nurse Practitioner

## 2015-10-16 ENCOUNTER — Other Ambulatory Visit: Payer: Self-pay | Admitting: Obstetrics and Gynecology

## 2015-10-16 NOTE — Telephone Encounter (Signed)
Pt called sts she has new insurance- [employer]- ID # [id number]  BIN# [id number]  [facility] # [facility]   Address is [place], [place] [address]. Pt is requesting refill for LAMICTAL XR 200 MG TB24 .

## 2015-10-17 LAB — CYTOLOGY - PAP

## 2015-10-17 MED ORDER — LAMICTAL XR 200 MG PO TB24: 1.0000 | ORAL_TABLET | Freq: Every day | ORAL | Status: DC

## 2015-10-17 NOTE — Telephone Encounter (Signed)
I was out of the office yesterday.  Message was forwarded to me today.  I called the patient back.  Says although her ins has changed, she will still get Rx [facility].  Stated although a Rx was sent last month, they are requiring a new Rx because the could not transfer prescription to her new policy.  Rx has been resent.  Receipt confirmed by pharmacy.

## 2015-12-20 ENCOUNTER — Telehealth: Payer: Self-pay | Admitting: Nurse Practitioner

## 2015-12-20 NOTE — Telephone Encounter (Signed)
Patient called back, HR sent back to [facility], states [facility] is not going to help her. Please call to advise.

## 2015-12-20 NOTE — Telephone Encounter (Signed)
[employer] company on pt's behalf ([facility] [phone] - pt [id number]). Lamictal XR is a non-preferred drug but does not require a PA. However, her copay is higher for branded drugs.  I attempted to have the Benefit Coverage Review Dept approve a brand name copay for a generic copay price but her plan has a disclaimer that does not allow the lower pricing.  It was suggested by them for her to go her husband's HR department to see if they will authorize a lower copay ([facility] stated the employer was the only one who can authorize the change in price).  I passed this information along to the patient and she will call them today.  She will call back if she needs anything further from [place].

## 2015-12-20 NOTE — Telephone Encounter (Signed)
Patient called to advise, insurance has denied PA for LAMICTAL XR 200 MG TB24, based on availability of generic. Patient states generic does not control her seizures. Patient has requested appeal and would like advise on what to do to get this medication covered.

## 2015-12-20 NOTE — Telephone Encounter (Signed)
She is going to call [employer] to see if they offer a copay savings program for [employer].  If not, we will schedule an appt with Dr. Krista Blue to discuss a potential treatment change.  She is unable to afford the $1200 copay for a 90-day supply. She will call me back if she needs further help.

## 2015-12-31 MED ORDER — LAMOTRIGINE ER 50 & 100 & 200 MG PO KIT: PACK | ORAL | Status: DC

## 2015-12-31 NOTE — Telephone Encounter (Signed)
Patient is calling back in regard to the [place] savings program.  Please call.

## 2015-12-31 NOTE — Telephone Encounter (Signed)
Spoke to patient - the only way for her to afford to stay on brand name Lamictal XR 265m daily is to participate in a co-pay savings program offered for starter packs.  In order to make the medication affordable, she will need #7 Lamictal XR 50/100/2071mstarter packs sent to [facility] for a 90-day supply with the instructions to take 20036maily.  This will drop her co-pay from $1200 to $225.  Ok per vo by Dr. YanKrista Blue provide this prescription.  Patient aware.

## 2016-02-19 DIAGNOSIS — Z0289 Encounter for other administrative examinations: Secondary | ICD-10-CM

## 2016-02-26 ENCOUNTER — Telehealth: Payer: Self-pay | Admitting: *Deleted

## 2016-02-26 NOTE — Telephone Encounter (Signed)
DMV form completed.   Sent to CM/NP for review and signature.

## 2016-02-26 NOTE — Telephone Encounter (Signed)
To MR. 

## 2016-02-26 NOTE — Telephone Encounter (Signed)
Reviewed and signed

## 2016-03-07 NOTE — Telephone Encounter (Signed)
Pt DMV form faxed on 03/05/16.

## 2016-05-02 ENCOUNTER — Ambulatory Visit
Admission: RE | Admit: 2016-05-02 | Discharge: 2016-05-02 | Disposition: A | Discharge status: Home / Self Care | Payer: 59 | Source: Ambulatory Visit | Attending: Family Medicine | Admitting: Family Medicine

## 2016-05-02 ENCOUNTER — Other Ambulatory Visit: Payer: Self-pay | Admitting: Family Medicine

## 2016-05-02 DIAGNOSIS — G43001 Migraine without aura, not intractable, with status migrainosus: Secondary | ICD-10-CM

## 2016-05-26 ENCOUNTER — Telehealth: Payer: Self-pay | Admitting: Neurology

## 2016-05-26 NOTE — Telephone Encounter (Signed)
Pt called in complaining of migraine headaches for the last month, one a day. She states she has had migraines before but not to this extent and frequency. She requested to come this week to see Dr. Krista Blue. I explained to the pt that Dr. Krista Blue is out of the office this week. Pt then asked to see Dr. Jaynee Eagles. She did not want to see NP as she states her PCP requested her see Dr. Krista Blue a physician . Please call and advise [phone]

## 2016-05-26 NOTE — Telephone Encounter (Signed)
Spoke to [place] - she has never been seen here before for her migraines.  States her PCP has been treating them for years.  She is going to call his office to have him send over a new problem referral, along w/ records, to become established her for this problem too.  In the meantime, she is going to continue care w/ PCP.

## 2016-05-30 ENCOUNTER — Other Ambulatory Visit: Payer: Self-pay | Admitting: Family Medicine

## 2016-05-30 DIAGNOSIS — G8929 Other chronic pain: Secondary | ICD-10-CM

## 2016-05-30 DIAGNOSIS — R51 Headache: Principal | ICD-10-CM

## 2016-06-03 ENCOUNTER — Ambulatory Visit
Admission: RE | Admit: 2016-06-03 | Discharge: 2016-06-03 | Disposition: A | Discharge status: Home / Self Care | Payer: 59 | Source: Ambulatory Visit | Attending: Family Medicine | Admitting: Family Medicine

## 2016-06-03 DIAGNOSIS — R519 Headache, unspecified: Secondary | ICD-10-CM

## 2016-06-03 DIAGNOSIS — R51 Headache: Principal | ICD-10-CM

## 2016-06-03 MED ORDER — GADOBENATE DIMEGLUMINE 529 MG/ML IV SOLN
12.0000 mL | Freq: Once | INTRAVENOUS | Status: AC | PRN
  Administered 2016-06-03: 12 mL via INTRAVENOUS

## 2016-06-05 ENCOUNTER — Encounter: Payer: Self-pay | Admitting: Neurology

## 2016-06-05 ENCOUNTER — Ambulatory Visit (INDEPENDENT_AMBULATORY_CARE_PROVIDER_SITE_OTHER): Payer: 59 | Admitting: Neurology

## 2016-06-05 VITALS — BP 108/73 | HR 75 | Ht 59.0 in | Wt 135.0 lb

## 2016-06-05 DIAGNOSIS — G43709 Chronic migraine without aura, not intractable, without status migrainosus: Secondary | ICD-10-CM

## 2016-06-05 DIAGNOSIS — G40209 Localization-related (focal) (partial) symptomatic epilepsy and epileptic syndromes with complex partial seizures, not intractable, without status epilepticus: Secondary | ICD-10-CM | POA: Diagnosis not present

## 2016-06-05 MED ORDER — LAMOTRIGINE ER 200 MG PO TB24: 200.0000 mg | ORAL_TABLET | Freq: Every day | ORAL | 4 refills | Status: DC

## 2016-06-05 NOTE — Patient Instructions (Signed)
Magnesium oxide 400 mg twice a day Riboflavin= vitamin B2 100 mg twice a day 

## 2016-06-05 NOTE — Progress Notes (Signed)
[facility]  PATIENT: Desiree Marshall DOB: Nov 09, 1980   HISTORY OF PRESENT ILLNESS:Ms Desiree Marshall, 35 year old female returns for her epilepsy, also reported worsening migraine headaches  She has history of seizures since she was a teenager, partial complex seizures, she would have aura of speech difficulty, confused, hot before she loss of consciousness, going into convulsion.  All the recurrent seizure is due to medications changes or missing dose. She totally had 3 seizures in her life time. Her last seizure was in 2009.   She has been taking brand-name Lamictal XR 200 mg every night, but her insurance would only pay for brand Lamictal starting package, she had healthy delivery of 2 children now at age 89 and 4 while taking Lamictal. She was using higher dose of LamictalR 250 mg every night during pregnancy,  Her last Lamictal level was 5.4 06/02/2014 after delivery of baby boy and her dose was reduced from brand Lamictal XR 250, qhs, to Lamictal 200 XR Brand every hs. She also has a history of Crohn's disease and is followed at [facility].   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 at age 69 and 34 years old, came in today complains of worsening headache, reported a history of migraine headaches since 1997, used to clustered around her period, about once a month, initially responding well to Aleve, since 2015, she began to have prolonged headaches, was getting prescription of Imitrex 100 mg tablets, works well,  But since July 2017, she had one prolonged episode of migraine headache requiring multiple dose of Imitrex, lasting 4-5 days, in August, she has almost constant low-grade of pressure headaches, she also complains difficulty sleeping sometimes, she had 3 bad headaches in August, that is no longer closely tied to her menstruation, Imitrex still works well for her headaches,  We have personally reviewed MRI of the brain with and without contrast on September fifth 2017  that was essentially normal, she is concerned about her intermittent sharp transient different area of her skull, which was previously associated with her recurrent seizure.    REVIEW OF SYSTEMS: Full 14 system review of systems performed and notable only for those listed, all others are neg:    ALLERGIES: Allergies  Allergen Reactions  . Promethazine Hcl Other (See Comments)    Pt states that this medication causes hallucinations.     HOME MEDICATIONS: Outpatient Medications Prior to Visit  Medication Sig Dispense Refill  . acetaminophen (TYLENOL) 500 MG tablet Take 1,000 mg by mouth every 6 (six) hours as needed for mild pain.     Marland Kitchen adalimumab (HUMIRA) 40 MG/0.8ML injection Inject 40 mg into the skin every 7 (seven) days.     Marland Kitchen azaTHIOprine (IMURAN) 50 MG tablet Take 100 mg by mouth at bedtime.     Marland Kitchen BEYAZ 3-0.02-0.451 MG tablet     . calcium carbonate (TUMS - DOSED IN MG ELEMENTAL CALCIUM) 500 MG chewable tablet Chew 1 tablet by mouth daily as needed for indigestion or heartburn.    . cholecalciferol (VITAMIN D) 1000 UNITS tablet Take 2,000 Units by mouth daily.     . folic acid (FOLVITE) 884 MCG tablet Take 3,200 mcg by mouth daily.     Marland Kitchen LAMICTAL XR 200 MG TB24 Take 1 tablet (200 mg total) by mouth daily. 90 tablet 3  . LamoTRIgine (LAMICTAL XR) 50 & 100 & 200 MG KIT Take 296m daily. 7 kit 0  . Prenat w/o A-FeCbn-Meth-FA-DHA (PRENATE MINI) 29-0.6-0.4-350 MG CAPS Take 1  tablet by mouth daily.     . sertraline (ZOLOFT) 50 MG tablet 75 mg.     . SUMAtriptan (IMITREX) 100 MG tablet TK 1/2 TO 1 T PO PRF MIGRAINE. MAY REPEAT 1/2 TO 1 T IN 2 HOURS PRN. NOT TO EXCEED 2 TS IN 24 HOURS.  5  . fluocinonide cream (LIDEX) 0.05 % APPLY TO HANDS AND FEET BID PRN  5   No facility-administered medications prior to visit.     PAST MEDICAL HISTORY: Past Medical History:  Diagnosis Date  . Anemia    Resolved  . Astigmatism   . Crohn's disease ([place])   . Epilepsy ([place])    Diagnosed at the  age of 7.   . Migraine     PAST SURGICAL HISTORY: Past Surgical History:  Procedure Laterality Date  . CESAREAN SECTION N/A 05/09/2014   Procedure: CESAREAN SECTION;  Surgeon: Daria Pastures, MD;  Location: [facility];  Service: Obstetrics;  Laterality: N/A;    FAMILY HISTORY: Family History  Problem Relation Age of Onset  . Asthma Mother   . Hypertension Mother   . Cancer Father 15    Prostate Cancer  . Heart disease Maternal Grandmother   . Heart disease Maternal Grandfather   . Stroke Paternal Grandmother   . Heart disease Paternal Grandfather     SOCIAL HISTORY: Social History   Social History  . Marital status: Married    Spouse name: Desiree Marshall   . Number of children: 1  . Years of education: Masters   Occupational History  . HOMEMAKER     Social History Main Topics  . Smoking status: Never Smoker  . Smokeless tobacco: Never Used  . Alcohol use No  . Drug use: No  . Sexual activity: Yes    Partners: Male    Birth control/ protection: None   Other Topics Concern  . Not on file   Social History Narrative   Marital Status:  Married Therapist, art)    Children:  Son Banker)    Pets: Dog (1)    Living Situation: Lives with husband and son   Occupation: Agricultural engineer    Education: Conservator, museum/gallery in [place] Use/Exposure:  None    Alcohol Use:  Occasional   Drug Use:  None   Diet:  Regular   Exercise:  Walking - once weekly    Hobbies: Baking - Sewing    Right-handed.   2 cups caffeine daily.                 PHYSICAL EXAM  Vitals:   06/05/16 0819  BP: 108/73  Pulse: 75  Weight: 135 lb (61.2 kg)  Height: '4\' 11"'  (1.499 m)   Body mass index is 27.27 kg/m. Generalized: Well developed, in no acute distress  Head: normocephalic and atraumatic,. Oropharynx benign  Neck: Supple, no carotid bruits  Cardiac: Regular rate rhythm, no murmur  Musculoskeletal: No deformity   Neurological examination   Mentation: Alert  oriented to time, place, history taking. Follows all commands speech and language fluent  Cranial nerve II-XII: Pupils were equal round reactive to light extraocular movements were full, visual field were full on confrontational test. Facial sensation and strength were normal. hearing was intact to finger rubbing bilaterally. Uvula tongue midline. head turning and shoulder shrug were normal and symmetric.Tongue protrusion into cheek strength was normal. Motor: normal bulk and tone, full strength in the BUE, BLE, No focal weakness Coordination: finger-nose-finger, heel-to-shin  bilaterally, no dysmetria Reflexes: Brachioradialis 2/2, biceps 2/2, triceps 2/2, patellar 2/2, Achilles 2/2, plantar responses were flexor bilaterally. Gait and Station: Rising up from seated position without assistance, normal stance, moderate stride, good arm swing, smooth turning, able to perform tiptoe, and heel walking without difficulty. Tandem gait is steady  DIAGNOSTIC DATA (LABS, IMAGING, TESTING) -   ASSESSMENT AND PLAN 35 y.o. year old female    Complex partial seizure, last recurrent seizure was 2009  Normal MRI of the brain in September 2017, other than borderline Arnold-Chiari malformation  Continue lamotrigine xr, switch to equivalent generic 200 mg every night  Repeat EEG  Check lamotrigine level  Worsening migraine headaches  Imitrex as needed     Marcial Pacas, M.D. Ph.D.  [facility] [place], [place] [address] Phone: [phone] Fax:      [phone]  CC: Derinda Late, MD

## 2016-06-23 ENCOUNTER — Ambulatory Visit (INDEPENDENT_AMBULATORY_CARE_PROVIDER_SITE_OTHER): Payer: 59 | Admitting: Neurology

## 2016-06-23 DIAGNOSIS — G40209 Localization-related (focal) (partial) symptomatic epilepsy and epileptic syndromes with complex partial seizures, not intractable, without status epilepticus: Secondary | ICD-10-CM | POA: Diagnosis not present

## 2016-06-23 DIAGNOSIS — G43709 Chronic migraine without aura, not intractable, without status migrainosus: Secondary | ICD-10-CM

## 2016-06-25 NOTE — Procedures (Signed)
   HISTORY: With history of epilepsy, complex partial seizure with secondary generalization, migraine headaches  TECHNIQUE:  16 channel EEG was performed based on standard 10-16 international system. One channel was dedicated to EKG, which has demonstrates normal sinus rhythm of 78 beats per minutes.  Upon awakening, the posterior background activity was well-developed, in alpha range 9 Hz, reactive to eye opening and closure. There was frequent bifrontal muscle artifact   There was no evidence of epileptiform discharge.  Photic stimulation was performed, which induced a symmetric photic driving.  Hyperventilation was performed, there was no abnormality elicit.  Stage II sleep was achieved.  CONCLUSION: This is a  normal  awake and asleep EEG.  There is no electrodiagnostic evidence of epileptiform discharge.  Desiree Marshall, M.D. Ph.D.  [facility] [address] [place], [place] [address] Phone: [phone] Fax:      [phone]

## 2016-07-19 ENCOUNTER — Emergency Department (HOSPITAL_BASED_OUTPATIENT_CLINIC_OR_DEPARTMENT_OTHER)
Admission: EM | Admit: 2016-07-19 | Discharge: 2016-07-20 | Disposition: A | Discharge status: Home / Self Care | Payer: Managed Care, Other (non HMO) | Attending: Emergency Medicine | Admitting: Emergency Medicine

## 2016-07-19 ENCOUNTER — Encounter (HOSPITAL_BASED_OUTPATIENT_CLINIC_OR_DEPARTMENT_OTHER): Payer: Self-pay | Admitting: Emergency Medicine

## 2016-07-19 DIAGNOSIS — N200 Calculus of kidney: Secondary | ICD-10-CM | POA: Diagnosis not present

## 2016-07-19 DIAGNOSIS — R109 Unspecified abdominal pain: Secondary | ICD-10-CM

## 2016-07-19 DIAGNOSIS — R10A0 Flank pain, unspecified side: Secondary | ICD-10-CM

## 2016-07-19 DIAGNOSIS — M549 Dorsalgia, unspecified: Secondary | ICD-10-CM | POA: Diagnosis present

## 2016-07-19 DIAGNOSIS — R1030 Lower abdominal pain, unspecified: Secondary | ICD-10-CM | POA: Insufficient documentation

## 2016-07-19 LAB — COMPREHENSIVE METABOLIC PANEL
ALBUMIN: 4.1 g/dL (ref 3.5–5.0)
ALK PHOS: 48 U/L (ref 38–126)
ALT: 27 U/L (ref 14–54)
ANION GAP: 11 (ref 5–15)
AST: 32 U/L (ref 15–41)
BILIRUBIN TOTAL: 0.4 mg/dL (ref 0.3–1.2)
BUN: 13 mg/dL (ref 6–20)
CALCIUM: 9.5 mg/dL (ref 8.9–10.3)
CO2: 24 mmol/L (ref 22–32)
Chloride: 101 mmol/L (ref 101–111)
Creatinine, Ser: 0.92 mg/dL (ref 0.44–1.00)
GFR calc Af Amer: >60 mL/min (ref >60)
GFR calc non Af Amer: >60 mL/min (ref >60)
GLUCOSE: 133 mg/dL — AB (ref 65–99)
Potassium: 3.5 mmol/L (ref 3.5–5.1)
Sodium: 136 mmol/L (ref 135–145)
TOTAL PROTEIN: 8.2 g/dL — AB (ref 6.5–8.1)

## 2016-07-19 LAB — URINE MICROSCOPIC-ADD ON

## 2016-07-19 LAB — URINALYSIS, ROUTINE W REFLEX MICROSCOPIC
GLUCOSE, UA: NEGATIVE mg/dL
KETONES UR: 15 mg/dL — AB
LEUKOCYTES UA: NEGATIVE
Nitrite: NEGATIVE
PH: 5.5 (ref 5.0–8.0)
Protein, ur: 30 mg/dL — AB
Specific Gravity, Urine: 1.04 — ABNORMAL HIGH (ref 1.005–1.030)

## 2016-07-19 LAB — CBC WITH DIFFERENTIAL/PLATELET
BASOS PCT: 0 %
Basophils Absolute: 0 10*3/uL (ref 0.0–0.1)
EOS ABS: 0.1 10*3/uL (ref 0.0–0.7)
Eosinophils Relative: 0 %
HCT: 42.3 % (ref 36.0–46.0)
Hemoglobin: 13.7 g/dL (ref 12.0–15.0)
Lymphocytes Relative: 9 %
Lymphs Abs: 1.4 10*3/uL (ref 0.7–4.0)
MCH: 28.8 pg (ref 26.0–34.0)
MCHC: 32.4 g/dL (ref 30.0–36.0)
MCV: 88.9 fL (ref 78.0–100.0)
MONO ABS: 1.6 10*3/uL — AB (ref 0.1–1.0)
MONOS PCT: 10 %
Neutro Abs: 12.1 10*3/uL — ABNORMAL HIGH (ref 1.7–7.7)
Neutrophils Relative %: 81 %
Platelets: 402 10*3/uL — ABNORMAL HIGH (ref 150–400)
RBC: 4.76 MIL/uL (ref 3.87–5.11)
RDW: 12.9 % (ref 11.5–15.5)
WBC: 15.1 10*3/uL — ABNORMAL HIGH (ref 4.0–10.5)

## 2016-07-19 LAB — PREGNANCY, URINE: Preg Test, Ur: NEGATIVE

## 2016-07-19 LAB — LIPASE, BLOOD: Lipase: 24 U/L (ref 11–51)

## 2016-07-19 MED ORDER — ONDANSETRON HCL 4 MG/2ML IJ SOLN
4.0000 mg | Freq: Once | INTRAMUSCULAR | Status: AC
  Administered 2016-07-19: 4 mg via INTRAVENOUS
  Filled 2016-07-19: qty 2

## 2016-07-19 MED ORDER — MORPHINE SULFATE (PF) 4 MG/ML IV SOLN
4.0000 mg | Freq: Once | INTRAVENOUS | Status: AC
  Administered 2016-07-19: 4 mg via INTRAVENOUS
  Filled 2016-07-19: qty 1

## 2016-07-19 MED ORDER — SODIUM CHLORIDE 0.9 % IV BOLUS (SEPSIS)
1000.0000 mL | Freq: Once | INTRAVENOUS | Status: AC
  Administered 2016-07-19: 1000 mL via INTRAVENOUS

## 2016-07-19 NOTE — ED Provider Notes (Signed)
Emergency Department Provider Note  By signing my name below, I, Neta Mends, attest that this documentation has been prepared under the direction and in the presence of Margette Fast, MD . Electronically Signed: Neta Mends, ED Scribe. 07/19/2016. 11:02 PM.  I have reviewed the triage vital signs and the nursing notes.   HISTORY  Chief Complaint Flank Pain   HPI Desiree Marshall is a 35 y.o. female with PMHx of epilepsy and Crohn's disease with complaint of constant left-sided back pain that began yesterday. Pt complains of associated difficulty urinating, pressure in the groin, chills, migraine, vomiting. Pt just started her period today. No alleviating factors noted. Pt denies constipation, dysuria, fever, diarrhea. No history of kidney stone. Pain not consistent with Crohn's pain. Has tried OTC pain medication without relief.   Past Medical History:  Diagnosis Date  . Anemia    Resolved  . Astigmatism   . Crohn's disease ([place])   . Epilepsy ([place])    Diagnosed at the age of 26.   . Migraine     Patient Active Problem List   Diagnosis Date Noted  . Postoperative state 05/09/2014  . Intrahepatic cholestasis of pregnancy 05/08/2014  . Pregnancy 05/08/2014  . Eczema 10/15/2013  . Routine general medical examination at a health care facility 10/04/2013  . Encounter for preconception consultation 08/03/2013  . Need for prophylactic vaccination and inoculation against influenza 08/03/2013  . 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 Encounter for long-term (current) use of other medications 06/22/2013  . Other malaise and fatigue 06/22/2013  . Hemorrhoids, residual tags 05/02/2011  . CD (Crohn's disease) ([place]) 04/08/2011  . BLURRED VISION 01/07/2010  . BREAST MASS, RIGHT 01/07/2010  . ASTHMATIC BRONCHITIS, ACUTE 10/05/2008  . SHOULDER PAIN, RIGHT 07/05/2008  . ANEMIA-IRON  DEFICIENCY 04/28/2007  . GERD 04/28/2007  . CROHN'S DISEASE 04/28/2007  . SEIZURE DISORDER 04/28/2007  . HEADACHE 04/28/2007    Past Surgical History:  Procedure Laterality Date  . CESAREAN SECTION N/A 05/09/2014   Procedure: CESAREAN SECTION;  Surgeon: Daria Pastures, MD;  Location: [facility];  Service: Obstetrics;  Laterality: N/A;    Current Outpatient Rx  . Order #: [id number] Class: Historical Med  . Order #: [id number] Class: Historical Med  . Order #: [id number] Class: Historical Med  . Order #: [id number] Class: Historical Med  . Order #: [id number] Class: Historical Med  . Order #: [id number] Class: Historical Med  . Order #: [id number] Class: Print  . Order #: [id number] Class: Historical Med  . Order #: [id number] Class: Normal  . Order #: [id number] Class: Print  . Order #: [id number] Class: Historical Med  . Order #: [id number] Class: Historical Med  . Order #: [id number] Class: Historical Med  . Order #: [id number] Class: Print    Allergies Promethazine hcl  Family History  Problem Relation Age of Onset  . Asthma Mother   . Hypertension Mother   . Cancer Father 83    Prostate Cancer  . Heart disease Maternal Grandmother   . Heart disease Maternal Grandfather   . Stroke Paternal Grandmother   . Heart disease Paternal Grandfather     Social History Social History  Substance Use Topics  . Smoking status: Never Smoker  . Smokeless tobacco: Never Used  . Alcohol use No    Review of Systems  Constitutional: No fever/chills Eyes: No visual changes. ENT: No sore throat. Cardiovascular: Denies chest pain. Respiratory: Denies shortness of breath. Gastrointestinal: Positive lower abdominal pain. Positive  nausea and vomiting.  No diarrhea.  No constipation. Genitourinary: Negative for dysuria. Positive left flank pain and urinary hesitancy.  Musculoskeletal: Negative for back pain. Skin: Negative for rash. Neurological: Negative for headaches, focal weakness or numbness.  10-point  ROS otherwise negative.  ____________________________________________   PHYSICAL EXAM:  VITAL SIGNS: ED Triage Vitals  Enc Vitals Group     BP 07/19/16 2158 134/98     Pulse Rate 07/19/16 2158 89     Resp 07/19/16 2158 20     Temp 07/19/16 2158 98.3 F (36.8 C)     SpO2 07/19/16 2158 99 %     Weight 07/19/16 2158 130 lb (59 kg)     Height 07/19/16 2158 4' 11"  (1.499 m)     Pain Score 07/19/16 2157 8   Constitutional: Alert and oriented. Well appearing and in no acute distress. Eyes: Conjunctivae are normal. PERRL.  Head: Atraumatic. Nose: No congestion/rhinnorhea. Mouth/Throat: Mucous membranes are moist.  Oropharynx non-erythematous. Neck: No stridor.  Cardiovascular: Normal rate, regular rhythm. Good peripheral circulation. Grossly normal heart sounds.   Respiratory: Normal respiratory effort.  No retractions. Lungs CTAB. Gastrointestinal: Soft with mild lower abdominal tenderness. Mild left CVA tenderness. No distention.  Musculoskeletal: No lower extremity tenderness nor edema. No gross deformities of extremities. Neurologic:  Normal speech and language. No gross focal neurologic deficits are appreciated.  Skin:  Skin is warm, dry and intact. No rash noted. Psychiatric: Mood and affect are normal. Speech and behavior are normal.  ____________________________________________  DIAGNOSTIC STUDIES:  Oxygen Saturation is 99% on RA, normal by my interpretation.    COORDINATION OF CARE:  11:15 PM Discussed treatment plan with pt at bedside and pt agreed to plan.   LABS (all labs ordered are listed, but only abnormal results are displayed)  Labs Reviewed  URINALYSIS, ROUTINE W REFLEX MICROSCOPIC (NOT AT [facility]) - Abnormal; Notable for the following:       Result Value   Color, Urine AMBER (*)    APPearance CLOUDY (*)    Specific Gravity, Urine 1.040 (*)    Hgb urine dipstick LARGE (*)    Bilirubin Urine SMALL (*)    Ketones, ur 15 (*)    Protein, ur 30 (*)    All  other components within normal limits  COMPREHENSIVE METABOLIC PANEL - Abnormal; Notable for the following:    Glucose, Bld 133 (*)    Total Protein 8.2 (*)    All other components within normal limits  CBC WITH DIFFERENTIAL/PLATELET - Abnormal; Notable for the following:    WBC 15.1 (*)    Platelets 402 (*)    Neutro Abs 12.1 (*)    Monocytes Absolute 1.6 (*)    All other components within normal limits  URINE MICROSCOPIC-ADD ON - Abnormal; Notable for the following:    Squamous Epithelial / LPF 6-30 (*)    Bacteria, UA FEW (*)    Crystals CA OXALATE CRYSTALS (*)    All other components within normal limits  PREGNANCY, URINE  LIPASE, BLOOD   ____________________________________________  RADIOLOGY  Ct Renal Stone Study  Result Date: 07/20/2016 CLINICAL DATA:  35 year old female with left-sided back pain. Difficulty urinating. EXAM: CT ABDOMEN AND PELVIS WITHOUT CONTRAST TECHNIQUE: Multidetector CT imaging of the abdomen and pelvis was performed following the standard protocol without IV contrast. COMPARISON:  CT dated 11/03/2006 FINDINGS: Evaluation of this exam is limited in the absence of intravenous contrast. Lower chest: The visualized lung bases are clear. No intra-abdominal free air  or free fluid. Hepatobiliary: No focal liver abnormality is seen. No gallstones, gallbladder wall thickening, or biliary dilatation. Pancreas: Unremarkable. No pancreatic ductal dilatation or surrounding inflammatory changes. Spleen: Normal in size without focal abnormality. Adrenals/Urinary Tract: The adrenal glands appear unremarkable. There is a 5 mm distal left ureteral calculus with mild left hydronephrosis. The right kidney, right ureter, and urinary bladder appear unremarkable. Stomach/Bowel: Top-normal fluid field small-bowel loops in the left hemi abdomen, likely reactive ileus. No evidence of bowel obstruction or active inflammation. Normal appendix. Vascular/Lymphatic: The abdominal aorta and  IVC are grossly unremarkable on this noncontrast study. No portal venous gas identified. There is no adenopathy. Reproductive: The uterus and ovaries are grossly unremarkable. Other: Small fat containing umbilical hernia. Musculoskeletal: No acute or significant osseous findings. IMPRESSION: A 5 mm distal left ureteral stone with mild left hydronephrosis. Electronically Signed   By: Anner Crete M.D.   On: 07/20/2016 01:59    ____________________________________________   PROCEDURES  Procedure(s) performed:   Procedures  None ____________________________________________   INITIAL IMPRESSION / ASSESSMENT AND PLAN / ED COURSE  Pertinent labs & imaging results that were available during my care of the patient were reviewed by me and considered in my medical decision making (see chart for details).  Patient resents emergency department for evaluation of left back pain and urinary hesitancy. No fever. Positive vomiting. Abdomen is soft and non-tender. The patient is currently menstruating. Patient appears uncomfortable but in no acute distress. Plan for labs, UA, and pain control. High suspicion for UTI/pyelonephritis with lower suspicion for intra-abdominal surgical process or pelvic emergency. Also considering kidney stone.    Positive for left kidney stone. No indication to suggest infected stone at this time. Pain is well controlled with Toradol. Discussed the diagnosis in detail with the patient. She will call to schedule Urology appointment on Monday. Discharged home with pain medication and Flomax. Discussed return precautions including specific signs/symptoms of urosepsis.   At this time, I do not feel there is any life-threatening condition present. I have reviewed and discussed all results (EKG, imaging, lab, urine as appropriate), exam findings with patient. I have reviewed nursing notes and appropriate previous records.  I feel the patient is safe to be discharged home without  further emergent workup. Discussed usual and customary return precautions. Patient and family (if present) verbalize understanding and are comfortable with this plan.  Patient will follow-up with their primary care provider. If they do not have a primary care provider, information for follow-up has been provided to them. All questions have been answered.  ____________________________________________  FINAL CLINICAL IMPRESSION(S) / ED DIAGNOSES  Final diagnoses:  Flank pain  Nephrolithiasis     MEDICATIONS GIVEN DURING THIS VISIT:  Medications  sodium chloride 0.9 % bolus 1,000 mL (0 mLs Intravenous Stopped 07/20/16 0030)  morphine 4 MG/ML injection 4 mg (4 mg Intravenous Given 07/19/16 2315)  ondansetron (ZOFRAN) injection 4 mg (4 mg Intravenous Given 07/19/16 2315)  ketorolac (TORADOL) 30 MG/ML injection 30 mg (30 mg Intravenous Given 07/20/16 0214)     NEW OUTPATIENT MEDICATIONS STARTED DURING THIS VISIT:  Discharge Medication List as of 07/20/2016  3:02 AM    START taking these medications   Details  docusate sodium (COLACE) 100 MG capsule Take 1 capsule (100 mg total) by mouth every 12 (twelve) hours., Starting Sun 07/20/2016, Print    oxyCODONE-acetaminophen (PERCOCET/ROXICET) 5-325 MG tablet Take 1-2 tablets by mouth every 4 (four) hours as needed for severe pain., Starting Sun 07/20/2016, Print  tamsulosin (FLOMAX) 0.4 MG CAPS capsule Take 1 capsule (0.4 mg total) by mouth daily., Starting Sun 07/20/2016, Print         Note:  This document was prepared using Dragon voice recognition software and may include unintentional dictation errors.  Nanda Quinton, MD Emergency Medicine  Documentation performed with the assistance of the scribe. I have reviewed the documentation and made changes as needed.     Margette Fast, MD 07/20/16 [phone]

## 2016-07-19 NOTE — ED Triage Notes (Signed)
Pt in c/o flank pain x 1 day and pressure in groin but unable to urinate as much as she feels like she needs to x 1 week. Pt alert, interactive, ambulatory in NAD.

## 2016-07-20 ENCOUNTER — Emergency Department (HOSPITAL_BASED_OUTPATIENT_CLINIC_OR_DEPARTMENT_OTHER): Payer: Managed Care, Other (non HMO)

## 2016-07-20 MED ORDER — KETOROLAC TROMETHAMINE 30 MG/ML IJ SOLN
30.0000 mg | Freq: Once | INTRAMUSCULAR | Status: AC
  Administered 2016-07-20: 30 mg via INTRAVENOUS
  Filled 2016-07-20: qty 1

## 2016-07-20 MED ORDER — TAMSULOSIN HCL 0.4 MG PO CAPS: 0.4000 mg | ORAL_CAPSULE | Freq: Every day | ORAL | 0 refills | Status: DC

## 2016-07-20 MED ORDER — OXYCODONE-ACETAMINOPHEN 5-325 MG PO TABS: 1.0000 | ORAL_TABLET | ORAL | 0 refills | Status: DC | PRN

## 2016-07-20 MED ORDER — DOCUSATE SODIUM 100 MG PO CAPS: 100.0000 mg | ORAL_CAPSULE | Freq: Two times a day (BID) | ORAL | 0 refills | Status: DC

## 2016-07-20 NOTE — Discharge Instructions (Signed)
You have been seen in the Emergency Department (ED) today for pain that we believe based on your workup, is caused by kidney stones.  As we have discussed, please drink plenty of fluids.  Please make a follow up appointment with the physician(s) listed elsewhere in this documentation.  You may take pain medication as needed but ONLY as prescribed.  Please also take your prescribed Flomax daily.  We also recommend that you take over-the-counter ibuprofen regularly according to label instructions over the next 5 days.  Take it with meals to minimize stomach discomfort.  Please see your doctor as soon as possible as stones may take 1-3 weeks to pass and you may require additional care or medications.  Do not drink alcohol, drive or participate in any other potentially dangerous activities while taking opiate pain medication as it may make you sleepy. Do not take this medication with any other sedating medications, either prescription or over-the-counter. If you were prescribed Percocet or Vicodin, do not take these with acetaminophen (Tylenol) as it is already contained within these medications.   Take Percocet as needed for severe pain.  This medication is an opiate (or narcotic) pain medication and can be habit forming.  Use it as little as possible to achieve adequate pain control.  Do not use or use it with extreme caution if you have a history of opiate abuse or dependence.  If you are on a pain contract with your primary care doctor or a pain specialist, be sure to let them know you were prescribed this medication today from the Emergency Department.  This medication is intended for your use only - do not give any to anyone else and keep it in a secure place where nobody else, especially children, have access to it.  It will also cause or worsen constipation, so you may want to consider taking an over-the-counter stool softener while you are taking this medication.  Return to the Emergency Department  (ED) or call your doctor if you have any worsening pain, fever, painful urination, are unable to urinate, or develop other symptoms that concern you.    Kidney Stones Kidney stones (urolithiasis) are deposits that form inside your kidneys. The intense pain is caused by the stone moving through the urinary tract. When the stone moves, the ureter goes into spasm around the stone. The stone is usually passed in the urine.  CAUSES  A disorder that makes certain neck glands produce too much parathyroid hormone (primary hyperparathyroidism). A buildup of uric acid crystals, similar to gout in your joints. Narrowing (stricture) of the ureter. A kidney obstruction present at birth (congenital obstruction). Previous surgery on the kidney or ureters. Numerous kidney infections. SYMPTOMS  Feeling sick to your stomach (nauseous). Throwing up (vomiting). Blood in the urine (hematuria). Pain that usually spreads (radiates) to the groin. Frequency or urgency of urination. DIAGNOSIS  Taking a history and physical exam. Blood or urine tests. CT scan. Occasionally, an examination of the inside of the urinary bladder (cystoscopy) is performed. TREATMENT  Observation. Increasing your fluid intake. Extracorporeal shock wave lithotripsy--This is a noninvasive procedure that uses shock waves to break up kidney stones. Surgery may be needed if you have severe pain or persistent obstruction. There are various surgical procedures. Most of the procedures are performed with the use of small instruments. Only small incisions are needed to accommodate these instruments, so recovery time is minimized. The size, location, and chemical composition are all important variables that will determine the proper  choice of action for you. Talk to your health care provider to better understand your situation so that you will minimize the risk of injury to yourself and your kidney.  HOME CARE INSTRUCTIONS  Drink enough water  and fluids to keep your urine clear or pale yellow. This will help you to pass the stone or stone fragments. Strain all urine through the provided strainer. Keep all particulate matter and stones for your health care provider to see. The stone causing the pain may be as small as a grain of salt. It is very important to use the strainer each and every time you pass your urine. The collection of your stone will allow your health care provider to analyze it and verify that a stone has actually passed. The stone analysis will often identify what you can do to reduce the incidence of recurrences. Only take over-the-counter or prescription medicines for pain, discomfort, or fever as directed by your health care provider. Keep all follow-up visits as told by your health care provider. This is important. Get follow-up X-rays if required. The absence of pain does not always mean that the stone has passed. It may have only stopped moving. If the urine remains completely obstructed, it can cause loss of kidney function or even complete destruction of the kidney. It is your responsibility to make sure X-rays and follow-ups are completed. Ultrasounds of the kidney can show blockages and the status of the kidney. Ultrasounds are not associated with any radiation and can be performed easily in a matter of minutes. Make changes to your daily diet as told by your health care provider. You may be told to: Limit the amount of salt that you eat. Eat 5 or more servings of fruits and vegetables each day. Limit the amount of meat, poultry, fish, and eggs that you eat. Collect a 24-hour urine sample as told by your health care provider. You may need to collect another urine sample every 6-12 months. SEEK MEDICAL CARE IF: You experience pain that is progressive and unresponsive to any pain medicine you have been prescribed. SEEK IMMEDIATE MEDICAL CARE IF:  Pain cannot be controlled with the prescribed medicine. You have a  fever or shaking chills. The severity or intensity of pain increases over 18 hours and is not relieved by pain medicine. You develop a new onset of abdominal pain. You feel faint or pass out. You are unable to urinate.   This information is not intended to replace advice given to you by your health care provider. Make sure you discuss any questions you have with your health care provider.   Document Released: 09/15/2005 Document Revised: 06/06/2015 Document Reviewed: 02/16/2013 Elsevier Interactive Patient Education [employer].

## 2016-07-24 ENCOUNTER — Encounter (HOSPITAL_COMMUNITY): Payer: Self-pay | Admitting: *Deleted

## 2016-07-24 ENCOUNTER — Other Ambulatory Visit: Payer: Self-pay | Admitting: Urology

## 2016-07-24 ENCOUNTER — Ambulatory Visit (HOSPITAL_COMMUNITY): Payer: Managed Care, Other (non HMO) | Admitting: Anesthesiology

## 2016-07-24 ENCOUNTER — Ambulatory Visit (HOSPITAL_COMMUNITY)
Admission: AD | Admit: 2016-07-24 | Discharge: 2016-07-24 | Disposition: A | Discharge status: Home / Self Care | Payer: Managed Care, Other (non HMO) | Source: Ambulatory Visit | Attending: Urology | Admitting: Urology

## 2016-07-24 ENCOUNTER — Encounter (HOSPITAL_COMMUNITY)
Admission: AD | Disposition: A | Discharge status: Home / Self Care | Payer: Self-pay | Source: Ambulatory Visit | Attending: Urology

## 2016-07-24 DIAGNOSIS — N132 Hydronephrosis with renal and ureteral calculous obstruction: Secondary | ICD-10-CM | POA: Diagnosis not present

## 2016-07-24 DIAGNOSIS — K219 Gastro-esophageal reflux disease without esophagitis: Secondary | ICD-10-CM | POA: Diagnosis not present

## 2016-07-24 DIAGNOSIS — K5090 Crohn's disease, unspecified, without complications: Secondary | ICD-10-CM | POA: Insufficient documentation

## 2016-07-24 DIAGNOSIS — Z888 Allergy status to other drugs, medicaments and biological substances status: Secondary | ICD-10-CM | POA: Diagnosis not present

## 2016-07-24 DIAGNOSIS — N201 Calculus of ureter: Secondary | ICD-10-CM

## 2016-07-24 DIAGNOSIS — Z87440 Personal history of urinary (tract) infections: Secondary | ICD-10-CM | POA: Diagnosis not present

## 2016-07-24 HISTORY — PX: HOLMIUM LASER APPLICATION: SHX5852

## 2016-07-24 HISTORY — PX: CYSTOSCOPY WITH RETROGRADE PYELOGRAM, URETEROSCOPY AND STENT PLACEMENT: SHX5789

## 2016-07-24 LAB — PREGNANCY, URINE: Preg Test, Ur: NEGATIVE

## 2016-07-24 SURGERY — CYSTOURETEROSCOPY, WITH RETROGRADE PYELOGRAM AND STENT INSERTION
Anesthesia: General | Site: Ureter | Laterality: Left

## 2016-07-24 MED ORDER — DEXAMETHASONE SODIUM PHOSPHATE 10 MG/ML IJ SOLN
INTRAMUSCULAR | Status: DC | PRN
  Administered 2016-07-24: 10 mg via INTRAVENOUS

## 2016-07-24 MED ORDER — FENTANYL CITRATE (PF) 100 MCG/2ML IJ SOLN
25.0000 ug | INTRAMUSCULAR | Status: DC | PRN
Start: 2016-07-24 — End: 2016-07-25
  Administered 2016-07-24: 50 ug via INTRAVENOUS

## 2016-07-24 MED ORDER — LIDOCAINE HCL (CARDIAC) 20 MG/ML IV SOLN
INTRAVENOUS | Status: DC | PRN
  Administered 2016-07-24: 100 mg via INTRAVENOUS

## 2016-07-24 MED ORDER — ONDANSETRON HCL 4 MG/2ML IJ SOLN
INTRAMUSCULAR | Status: DC | PRN
  Administered 2016-07-24: 4 mg via INTRAVENOUS

## 2016-07-24 MED ORDER — SUGAMMADEX SODIUM 200 MG/2ML IV SOLN
INTRAVENOUS | Status: DC | PRN
  Administered 2016-07-24: 120 mg via INTRAVENOUS

## 2016-07-24 MED ORDER — ONDANSETRON HCL 4 MG/2ML IJ SOLN
INTRAMUSCULAR | Status: AC
  Filled 2016-07-24: qty 2

## 2016-07-24 MED ORDER — PROPOFOL 10 MG/ML IV BOLUS
INTRAVENOUS | Status: DC | PRN
  Administered 2016-07-24: 150 mg via INTRAVENOUS

## 2016-07-24 MED ORDER — ONDANSETRON HCL 4 MG/2ML IJ SOLN: 4.0000 mg | Freq: Once | INTRAMUSCULAR | Status: DC | PRN

## 2016-07-24 MED ORDER — MIDAZOLAM HCL 2 MG/2ML IJ SOLN
INTRAMUSCULAR | Status: AC
  Filled 2016-07-24: qty 2

## 2016-07-24 MED ORDER — LACTATED RINGERS IV SOLN
INTRAVENOUS | Status: DC | PRN
  Administered 2016-07-24: 18:00:00 via INTRAVENOUS

## 2016-07-24 MED ORDER — FENTANYL CITRATE (PF) 100 MCG/2ML IJ SOLN
INTRAMUSCULAR | Status: AC
  Administered 2016-07-24: 50 ug via INTRAVENOUS
  Filled 2016-07-24: qty 2

## 2016-07-24 MED ORDER — SUCCINYLCHOLINE CHLORIDE 200 MG/10ML IV SOSY
PREFILLED_SYRINGE | INTRAVENOUS | Status: DC | PRN
  Administered 2016-07-24: 100 mg via INTRAVENOUS

## 2016-07-24 MED ORDER — PROPOFOL 10 MG/ML IV BOLUS
INTRAVENOUS | Status: AC
  Filled 2016-07-24: qty 20

## 2016-07-24 MED ORDER — SUGAMMADEX SODIUM 200 MG/2ML IV SOLN
INTRAVENOUS | Status: AC
  Filled 2016-07-24: qty 2

## 2016-07-24 MED ORDER — FENTANYL CITRATE (PF) 100 MCG/2ML IJ SOLN
INTRAMUSCULAR | Status: DC | PRN
  Administered 2016-07-24 (×2): 25 ug via INTRAVENOUS
  Administered 2016-07-24: 50 ug via INTRAVENOUS

## 2016-07-24 MED ORDER — IOHEXOL 300 MG/ML  SOLN
INTRAMUSCULAR | Status: DC | PRN
  Administered 2016-07-24: 4 mL via URETHRAL

## 2016-07-24 MED ORDER — CEFAZOLIN SODIUM-DEXTROSE 2-4 GM/100ML-% IV SOLN
INTRAVENOUS | Status: AC
  Filled 2016-07-24: qty 100

## 2016-07-24 MED ORDER — ROCURONIUM BROMIDE 10 MG/ML (PF) SYRINGE
PREFILLED_SYRINGE | INTRAVENOUS | Status: DC | PRN
  Administered 2016-07-24: 20 mg via INTRAVENOUS

## 2016-07-24 MED ORDER — MIDAZOLAM HCL 5 MG/5ML IJ SOLN
INTRAMUSCULAR | Status: DC | PRN
  Administered 2016-07-24 (×2): 1 mg via INTRAVENOUS

## 2016-07-24 MED ORDER — CEFAZOLIN SODIUM-DEXTROSE 2-4 GM/100ML-% IV SOLN
2.0000 g | INTRAVENOUS | Status: AC
  Administered 2016-07-24: 2 g via INTRAVENOUS
  Filled 2016-07-24: qty 100

## 2016-07-24 MED ORDER — FENTANYL CITRATE (PF) 100 MCG/2ML IJ SOLN
INTRAMUSCULAR | Status: AC
  Filled 2016-07-24: qty 2

## 2016-07-24 SURGICAL SUPPLY — 24 items
BAG URO CATCHER STRL LF (MISCELLANEOUS) ×4 IMPLANT
BASKET ZERO TIP NITINOL 2.4FR (BASKET) ×4 IMPLANT
BSKT STON RTRVL ZERO TP 2.4FR (BASKET) ×2
CATH INTERMIT  6FR 70CM (CATHETERS) ×2 IMPLANT
CATH URET 5FR 28IN CONE TIP (BALLOONS)
CATH URET 5FR 70CM CONE TIP (BALLOONS) ×2 IMPLANT
CATH URET WHISTLE 6FR (CATHETERS) ×4 IMPLANT
CLOTH BEACON ORANGE TIMEOUT ST (SAFETY) ×4 IMPLANT
FIBER LASER FLEXIVA 1000 (UROLOGICAL SUPPLIES) IMPLANT
FIBER LASER FLEXIVA 365 (UROLOGICAL SUPPLIES) IMPLANT
FIBER LASER FLEXIVA 550 (UROLOGICAL SUPPLIES) IMPLANT
FIBER LASER TRAC TIP (UROLOGICAL SUPPLIES) ×2 IMPLANT
GLOVE BIO SURGEON STRL SZ7.5 (GLOVE) ×4 IMPLANT
GOWN STRL REUS W/TWL XL LVL3 (GOWN DISPOSABLE) ×4 IMPLANT
GUIDEWIRE ANG ZIPWIRE 038X150 (WIRE) ×2 IMPLANT
GUIDEWIRE STR DUAL SENSOR (WIRE) ×4 IMPLANT
MANIFOLD NEPTUNE II (INSTRUMENTS) ×4 IMPLANT
PACK CYSTO (CUSTOM PROCEDURE TRAY) ×4 IMPLANT
SHEATH ACCESS URETERAL 24CM (SHEATH) ×2 IMPLANT
SHEATH ACCESS URETERAL 38CM (SHEATH) ×2 IMPLANT
STENT URET 6FRX24 CONTOUR (STENTS) ×2 IMPLANT
TUBING CONNECTING 10 (TUBING) ×3 IMPLANT
TUBING CONNECTING 10' (TUBING) ×1
WIRE COONS/BENSON .038X145CM (WIRE) ×2 IMPLANT

## 2016-07-24 NOTE — Anesthesia Procedure Notes (Signed)
Procedure Name: Intubation Date/Time: 07/24/2016 6:27 PM Performed by: Jarvis Newcomer A Pre-anesthesia Checklist: Patient identified, Emergency Drugs available, Suction available, Patient being monitored and Timeout performed Patient Re-evaluated:Patient Re-evaluated prior to inductionOxygen Delivery Method: Circle system utilized Preoxygenation: Pre-oxygenation with 100% oxygen Intubation Type: IV induction, Rapid sequence and Cricoid Pressure applied Laryngoscope Size: Mac and 3 Grade View: Grade I Tube type: Oral Tube size: 7.0 mm Number of attempts: 1 Airway Equipment and Method: Stylet Placement Confirmation: ETT inserted through vocal cords under direct vision,  positive ETCO2 and breath sounds checked- equal and bilateral Secured at: 21 cm Tube secured with: Tape Dental Injury: Teeth and Oropharynx as per pre-operative assessment

## 2016-07-24 NOTE — Op Note (Signed)
Preoperative diagnosis: Left distal ureteral stone Postoperative diagnosis: Left distal ureteral stone  Procedure: Cystoscopy with left retrograde pyelogram, left ureteroscopy, holmium laser lithotripsy and left ureteral stent placement  Surgeon: Mena Goes  Anesthesia: Gen.  Indication for procedure: 35 year old with symptomatic left distal ureteral stone. She elected surgical intervention after considering the nature, potential benefits, risks and alternatives to URS. We discussed side effects of the proposed treatment, the likelihood of the patient achieving the goals of the procedure, and any potential problems that might occur during the procedure or recuperation.   Findings: On cystoscopy the urethra and the bladder were unremarkable. The urine was clear in the bladder. There were no stones or foreign bodies in the bladder.  On scout imaging the stone was noted in the left distal ureter.  Left retrograde pyelogram-this outlined a filling defect at the ureterovesical junction on the left with mild proximal hydroureteronephrosis.  On ureteroscopy the sensor wire I traveled extraluminal and then returned intraluminal around the stone. I passed a Glidewire under direct vision and removed a sensor wire. There was significant erythema and edema at the stone impaction site.  Description of procedure: After consent was obtained patient brought to the operating room. After adequate anesthesia she was placed in lithotomy position and prepped and draped in the usual sterile fashion. A timeout was performed to confirm the patient and procedure. The cystoscope was passed per urethra and the bladder irrigated several times. The 6 [place] open-ended catheter was used to cannulate the left ureteral orifice and retrograde injection of contrast was performed. I then advanced a sensor wire into the left ureteral orifice but it coiled on the stone. I passed the 6 [place] open-ended catheter into the ureteral  orifice to brace the wire and it was still quite difficult and took several attempts to get the wire tip to straighten and pass proximally. The cystoscope and a 6 [place] open-ended catheter removed and the patient bladder drained. The needlepoint semirigid ureteroscope was advanced through the intramural ureter to the UVJ on the left but it was quite tight. I could just see the stone proximally. I backed the scope out and then passed the 6 [place] open-ended catheter to help dilate but the 6 [place] open-ended catheter would not pass proximally to the stone. I repassed the semirigid scope and this created enough from where I was able to approach the stone and it was contacted with a 200  laser fiber directly and fragmented 0.6 and 6 and then finished off at 0.2 and 50. The fragments washed proximally and it became evident the sensor wire exited the true lumen and then reentered almost immediately. Therefore in the lumen proximal to the stone impaction site I passed a Glidewire under direct vision and coiled this in the upper pole. I then backed out the semirigid and removed the sensor wire. I repassed the semirigid scope into the distal ureter and collected the fragments. There was not a lot remaining. I repassed the semirigid all the way up to the iliacs and the ureter was clear. The stone impaction site and the wire exit site prompted me to leave a stent without string as I didn't want pulled out prematurely. Therefore the Glidewire was backloaded on the cystoscope and a 6 x 24 cm ureteral stent was advanced. The wire was removed and a good coil seen in the kidney and a good coil in the bladder. The bladder was drained and the scope removed. She was awakened and taken to recovery room in  stable condition.  Complications: None  Blood loss: Minimal  Specimens: Stone fragments to office lab  Drains: 6 x 24 cm left ureteral stent  Disposition: Patient stable to PACU

## 2016-07-24 NOTE — Discharge Instructions (Signed)
Ureteral Stent Implantation, Care After Refer to this sheet in the next few weeks. These instructions provide you with information on caring for yourself after your procedure. Your health care provider may also give you more specific instructions. Your treatment has been planned according to current medical practices, but problems sometimes occur. Call your health care provider if you have any problems or questions after your procedure. WHAT TO EXPECT AFTER THE PROCEDURE You should be back to normal activity within 48 hours after the procedure. Nausea and vomiting may occur and are commonly the result of anesthesia. It is common to experience sharp pain in the back or lower abdomen and penis with voiding. This is caused by movement of the ends of the stent with the act of urinating.It usually goes away within minutes after you have stopped urinating. HOME CARE INSTRUCTIONS Make sure to drink plenty of fluids. You may have small amounts of bleeding, causing your urine to be red. This is normal. Certain movements may trigger pain or a feeling that you need to urinate. You may be given medicines to prevent infection or bladder spasms. Be sure to take all medicines as directed. Only take over-the-counter or prescription medicines for pain, discomfort, or fever as directed by your health care provider. Do not take aspirin, as this can make bleeding worse.  Your stent will be left in for 1-2 weeks. The stent will be removed by your health care provider in the office. Be sure to keep all follow-up appointments so your health care provider can check that you are healing properly.  SEEK MEDICAL CARE IF:  You experience increasing pain.  Your pain medicine is not working. SEEK IMMEDIATE MEDICAL CARE IF:  Your urine is dark red or has blood clots.  You are leaking urine (incontinent).  You have a fever, chills, feeling sick to your stomach (nausea), or vomiting.  Your pain is not relieved by pain  medicine.  The end of the stent comes out of the urethra.  You are unable to urinate.   This information is not intended to replace advice given to you by your health care provider. Make sure you discuss any questions you have with your health care provider.   Document Released: 05/18/2013 Document Revised: 09/20/2013 Document Reviewed: 03/30/2015 Elsevier Interactive Patient Education [employer]2016 Elsevier Inc.

## 2016-07-24 NOTE — H&P (Signed)
Office Visit Report     07/24/2016   --------------------------------------------------------------------------------   Desiree Marshall  MRN: [id number]  PRIMARY CARE:  Marylene Land, MD  DOB: 12-Jun-1981, 35 year old Female  REFERRING:    SSN: [id number]  PROVIDER:  Raynelle Bring, M.D.    TREATING:  Festus Aloe, M.D.    LOCATION:  [facility] [phone]   --------------------------------------------------------------------------------   CC/HPI: Left ureteral calculus   Desiree Marshall is a very pleasant 35 year old female seen today in follow-up for a left ureteral stone. On Friday, she began having severe bladder pain and pressure with associated nausea and vomiting. She presented to the emergency department and underwent a CT stone study on 07/20/16 which confirmed a 5 mm distal left ureteral stone. She has been placed on tamsulosin and given a prescription for Percocet.   Her pain improved. She saw Dr. Alinda Money yesterday. She still had intermittent discomfort. She was scheduled for URS. Her pain returned today and is uncontrolled. She hasn't seen a stone pass. She hasn't had any fever or chills.   She denies any history of urolithiasis. She has no family history of urolithiasis. She denies a history of hematuria, STDs, GU trauma/surgery. She did have a history of frequent UTIs as a child.     ALLERGIES: Promethazine    MEDICATIONS: Oxycodone-Acetaminophen 5 mg-325 mg tablet 1-2 tablet PO Q 6 H prn  Percocet 5 mg-325 mg tablet  Zyrtec 10 mg capsule  Azathioprine 50 mg tablet  Colace  Flomax 0.4 mg capsule, ext release 24 hr  Flonase Allergy Relief 50 mcg/actuation spray, suspension  Folic Acid  Humira 10 DD/2.2 ml syringe kit  Lamotrigine 200 mg tablet  Magnesium Oxide 400 mg tablet  Montelukast Sodium 10 mg tablet  Prenatal Multivitamin  Riboflavin 100 mg tablet  Sertraline Hcl 50 mg tablet  Vitamin D3 2,000 unit capsule  Yaz 0.02 mg-3 mg (24) tablet      GU PSH: No GU PSH    NON-GU PSH: C-Section - 2015    GU PMH: Calculus Ureter - 07/23/2016    NON-GU PMH: Crohn's Disease Seizure disorder    FAMILY HISTORY: Asthma - Mother Heart Disease - Runs in Family Hypertension - Mother Ischemic Stroke - Runs in Family prostate cancer in father - Father   SOCIAL HISTORY: Marital Status: Married Current Smoking Status: Patient has never smoked.  Has never drank.  Drinks 2 caffeinated drinks per day. 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    REVIEW OF SYSTEMS:    GU Review Female:   Patient reports burning /pain with urination and trouble starting your stream. Patient denies frequent urination, hard to postpone urination, get up at night to urinate, leakage of urine, stream starts and stops, have to strain to urinate, and currently pregnant.  Gastrointestinal (Upper):   Patient reports nausea and indigestion/ heartburn. Patient denies vomiting.  Gastrointestinal (Lower):   Patient denies diarrhea and constipation.  Constitutional:   Patient reports fatigue. Patient denies fever, night sweats, and weight loss.  Skin:   Patient denies skin rash/ lesion and itching.  Eyes:   Patient denies blurred vision and double vision.  Ears/ Nose/ Throat:   Patient reports sinus problems. Patient denies sore throat.  Hematologic/Lymphatic:   Patient denies swollen glands and easy bruising.  Cardiovascular:   Patient denies leg swelling and chest pains.  Respiratory:   Patient denies cough and shortness of breath.  Endocrine:   Patient denies excessive thirst.  Musculoskeletal:  Patient reports back pain. Patient denies joint pain.  Neurological:   Patient denies headaches and dizziness.  Psychologic:   Patient denies depression and anxiety.   VITAL SIGNS:      07/24/2016 02:27 PM  BP 115/79 mmHg  Heart Rate 96 /min  Temperature 98.1 F / 37 C   MULTI-SYSTEM PHYSICAL EXAMINATION:    Constitutional: Well-nourished. No physical  deformities. Normally developed. Good grooming.  Neck: Neck symmetrical, not swollen. Normal tracheal position.  Respiratory: No labored breathing, no use of accessory muscles.   Cardiovascular: Normal temperature, normal extremity pulses, no swelling, no varicosities.  Neurologic / Psychiatric: Oriented to time, oriented to place, oriented to person. No depression, no anxiety, no agitation.     PAST DATA REVIEWED:  Source Of History:  Outside Source, Healthcare Provider  Records Review:   Previous Doctor Records  X-Ray Review: C.T. Abdomen/Pelvis: Reviewed Films.     PROCEDURES:         KUB - 74000  A single view of the abdomen is obtained.      the bowel gas pattern is normal. There is some constipation. The bones appeared normal. The stone is obscured by bowel gas but on the edge appears to be a stable left distal ureteral stone.         Urinalysis w/Scope Dipstick Dipstick Cont'd Micro  Color: Amber Bilirubin: Neg WBC/hpf: 6 - 10/hpf  Appearance: Cloudy Ketones: Neg RBC/hpf: 3 - 10/hpf  Specific Gravity: 1.025 Blood: Trace Bacteria: Mod (26-50/hpf)  pH: 6.0 Protein: Trace Cystals: Ca Oxalate  Glucose: Neg Urobilinogen: 0.2 Casts: Hyaline    Nitrites: Neg Trichomonas: Not Present    Leukocyte Esterase: Neg Mucous: Present      Epithelial Cells: 6 - 10/hpf      Yeast: NS (Not Seen)      Sperm: Not Present         Ketoralac 28m - [id number] by Dr. ELana Fish 60 Adm. By: EAlcide Goodness Unit: mg Lot No [id number]  Route: IM Exp. Date 03/28/2018  Freq: None Mfgr.:   Site: Right Hip   ASSESSMENT:      ICD-10 Details  1 GU:   Calculus Ureter - N20.1    PLAN:           Orders Labs Urine Culture and Sensitivity          Schedule Return Visit: ASAP - Office Visit, Schedule Surgery          Document Letter(s):  Created for Patient: Clinical Summary         Notes:   left distal ureteral stone - I discussed with the patient again the nature of  risks benefits and alternatives to left ureteroscopy including continued stone passage and shockwave lithotripsy. All questions answered. She's feeling much better after some toradol, but wants to proceed with URS as soon as possible and I'll set her up for ureteroscopy tonight. There was some time in the AM but no laser availability. There is some time later tomorrow at 12:45 pm, but she'd like to go ahead and proceed to tonight. I sent urine cx as a precaution. I do not think she is clinically infected.   cc; Dr. BAlinda Money Dr. BSandi Mariscal   * Signed by MFestus Aloe M.D. on 07/24/16 at 4:42 PM (EDT)*     The information contained in this medical record document is considered private and confidential patient information. This information can only be used for the medical diagnosis  and/or medical services that are being provided by the patient's selected caregivers. This information can only be distributed outside of the patient's care if the patient agrees and signs waivers of authorization for this information to be sent to an outside source or route.

## 2016-07-24 NOTE — Anesthesia Preprocedure Evaluation (Addendum)
Anesthesia Evaluation  Patient identified by MRN, date of birth, ID band Patient awake    Reviewed: Allergy & Precautions, NPO status , Patient's Chart, lab work & pertinent test results  History of Anesthesia Complications Negative for: history of anesthetic complications  Airway Mallampati: II  TM Distance: >3 FB Neck ROM: Full    Dental  (+) Teeth Intact, Dental Advisory Given   Pulmonary neg pulmonary ROS,    Pulmonary exam normal breath sounds clear to auscultation       Cardiovascular Exercise Tolerance: Good negative cardio ROS Normal cardiovascular exam Rhythm:Regular Rate:Normal     Neuro/Psych  Headaches, Seizures -, Well Controlled,  negative psych ROS   GI/Hepatic Neg liver ROS, GERD  Medicated,Crohn's disease    Endo/Other  negative endocrine ROS  Renal/GU negative Renal ROS     Musculoskeletal negative musculoskeletal ROS (+)   Abdominal   Peds  Hematology  (+) Blood dyscrasia, anemia ,   Anesthesia Other Findings Day of surgery medications reviewed with the patient.  Reproductive/Obstetrics negative OB ROS                            Anesthesia Physical Anesthesia Plan  ASA: II  Anesthesia Plan: General   Post-op Pain Management:    Induction: Intravenous  Airway Management Planned: Oral ETT  Additional Equipment:   Intra-op Plan:   Post-operative Plan: Extubation in OR  Informed Consent: I have reviewed the patients History and Physical, chart, labs and discussed the procedure including the risks, benefits and alternatives for the proposed anesthesia with the patient or authorized representative who has indicated his/her understanding and acceptance.   Dental advisory given  Plan Discussed with: CRNA  Anesthesia Plan Comments: (Risks/benefits of general anesthesia discussed with patient including risk of damage to teeth, lips, gum, and tongue,  nausea/vomiting, allergic reactions to medications, and the possibility of heart attack, stroke and death.  All patient questions answered.  Patient wishes to proceed.)        Anesthesia Quick Evaluation

## 2016-07-24 NOTE — Transfer of Care (Signed)
Immediate Anesthesia Transfer of Care Note  Patient: Desiree Marshall  Procedure(s) Performed: Procedure(s): CYSTOSCOPY WITH RETROGRADE PYELOGRAM, URETEROSCOPY AND STENT PLACEMENT and laser lithotripsy (Left) HOLMIUM LASER APPLICATION (Left)  Patient Location: PACU  Anesthesia Type:General  Level of Consciousness: awake, alert , oriented and patient cooperative  Airway & Oxygen Therapy: Patient Spontanous Breathing and Patient connected to face mask oxygen  Post-op Assessment: Report given to RN, Post -op Vital signs reviewed and stable and Patient moving all extremities  Post vital signs: Reviewed and stable  Last Vitals:  Vitals:   07/24/16 1710  BP: 116/63  Pulse: 80  Resp: 17  Temp: 36.7 C    Last Pain:  Vitals:   07/24/16 1737  TempSrc:   PainSc: 2       Patients Stated Pain Goal: 3 (07/24/16 1737)  Complications: No apparent anesthesia complications

## 2016-07-25 ENCOUNTER — Encounter (HOSPITAL_BASED_OUTPATIENT_CLINIC_OR_DEPARTMENT_OTHER): Admission: RE | Payer: Self-pay | Source: Ambulatory Visit

## 2016-07-25 ENCOUNTER — Encounter (HOSPITAL_COMMUNITY): Payer: Self-pay | Admitting: Urology

## 2016-07-25 ENCOUNTER — Ambulatory Visit (HOSPITAL_BASED_OUTPATIENT_CLINIC_OR_DEPARTMENT_OTHER): Admission: RE | Admit: 2016-07-25 | Payer: Managed Care, Other (non HMO) | Source: Ambulatory Visit | Admitting: Urology

## 2016-07-25 SURGERY — CYSTOURETEROSCOPY, WITH RETROGRADE PYELOGRAM AND STENT INSERTION
Anesthesia: General | Laterality: Left

## 2016-07-28 NOTE — Anesthesia Postprocedure Evaluation (Signed)
Anesthesia Post Note  Patient: Desiree Marshall  Procedure(s) Performed: Procedure(s) (LRB): CYSTOSCOPY WITH RETROGRADE PYELOGRAM, URETEROSCOPY AND STENT PLACEMENT and laser lithotripsy (Left) HOLMIUM LASER APPLICATION (Left)  Patient location during evaluation: PACU Anesthesia Type: General Level of consciousness: awake and alert Pain management: pain level controlled Vital Signs Assessment: post-procedure vital signs reviewed and stable Respiratory status: spontaneous breathing, nonlabored ventilation, respiratory function stable and patient connected to nasal cannula oxygen Cardiovascular status: blood pressure returned to baseline and stable Postop Assessment: no signs of nausea or vomiting Anesthetic complications: no    Last Vitals:  Vitals:   07/24/16 2030 07/24/16 2038  BP: 113/75   Pulse: 86 88  Resp: 14 16  Temp:  36.8 C    Last Pain:  Vitals:   07/24/16 2038  TempSrc:   PainSc: 3                  Cecile Hearing

## 2016-08-07 ENCOUNTER — Other Ambulatory Visit: Payer: Self-pay | Admitting: Family Medicine

## 2016-08-07 DIAGNOSIS — J329 Chronic sinusitis, unspecified: Secondary | ICD-10-CM

## 2016-08-11 ENCOUNTER — Ambulatory Visit
Admission: RE | Admit: 2016-08-11 | Discharge: 2016-08-11 | Disposition: A | Discharge status: Home / Self Care | Payer: Managed Care, Other (non HMO) | Source: Ambulatory Visit | Attending: Family Medicine | Admitting: Family Medicine

## 2016-08-11 DIAGNOSIS — J329 Chronic sinusitis, unspecified: Secondary | ICD-10-CM

## 2016-08-20 ENCOUNTER — Ambulatory Visit (INDEPENDENT_AMBULATORY_CARE_PROVIDER_SITE_OTHER): Payer: 59 | Admitting: Allergy

## 2016-08-20 ENCOUNTER — Encounter: Payer: Self-pay | Admitting: Allergy

## 2016-08-20 VITALS — BP 114/70 | HR 88 | Temp 97.8°F | Resp 16 | Ht 58.5 in | Wt 137.2 lb

## 2016-08-20 DIAGNOSIS — H1010 Acute atopic conjunctivitis, unspecified eye: Secondary | ICD-10-CM

## 2016-08-20 DIAGNOSIS — J309 Allergic rhinitis, unspecified: Secondary | ICD-10-CM | POA: Diagnosis not present

## 2016-08-20 MED ORDER — OLOPATADINE HCL 0.7 % OP SOLN: 1.0000 [drp] | OPHTHALMIC | 5 refills | Status: DC

## 2016-08-20 MED ORDER — AZELASTINE HCL 0.15 % NA SOLN: 2.0000 | Freq: Two times a day (BID) | NASAL | 5 refills | Status: DC

## 2016-08-20 NOTE — Progress Notes (Signed)
New Patient Note  RE: Desiree Marshall MRN: [id number] DOB: [birth year] Date of Office Visit: 08/20/2016  Referring provider: Derinda Late, MD Primary care provider: Marylene Land, MD  Chief Complaint: Nasal congestion  History of present illness: Desiree Marshall is a 35 y.o. female presenting today for consultation for nasal congestion.  She has been having nasal congestion and drainage for the past 6 months.  She was also having migraines that were occurring almost daily.    She had a CT sinus done that showed evidence of sinusitis.  She was treated with antibiotics at least 2 rounds (levaquin in Aug) and prednisone.  She does not feel she has gotten much benefit from antibiotics or steroids.  She has tried variety of medications including claritin, zyrtec, singulair and flonase.  She is currently on zyrtec and singulair.  Since being off her Singulair and Zyrtec for allergy testing today she has noted worsening of her symptoms.   She also has been treated for a bacterial infection in her nose with mupirocin.  She continues to have to dry, crusting at the nares entry as well as cracking and some bleeding.  She has been using vitamin E on this area.  She is having difficulty sleeping due to the congestion.   She also has drainage and redness of eyes as well as sneezing.     She has a history of crohn's diagnosed in early 2000.    She has eczema of her hands which she uses lidex cream as needed.   Review of systems: Review of Systems  Constitutional: Positive for malaise/fatigue. Negative for chills and fever.  HENT: Positive for congestion and nosebleeds. Negative for sinus pain and sore throat.   Eyes: Positive for discharge and redness. Negative for pain.  Respiratory: Negative for cough, shortness of breath and wheezing.   Cardiovascular: Negative for chest pain.  Gastrointestinal: Negative for heartburn and nausea.  Skin: Negative for itching and rash.    All other systems  negative unless noted above in HPI  Past medical history: Past Medical History:  Diagnosis Date  . Anemia    Resolved  . Astigmatism   . Crohn's disease ([place])   . Eczema   . Epilepsy ([place])    Diagnosed at the age of 53.   . Migraine     Past surgical history: Past Surgical History:  Procedure Laterality Date  . CESAREAN SECTION N/A 05/09/2014   Procedure: CESAREAN SECTION;  Surgeon: Daria Pastures, MD;  Location: [facility];  Service: Obstetrics;  Laterality: N/A;  . CYSTOSCOPY WITH RETROGRADE PYELOGRAM, URETEROSCOPY AND STENT PLACEMENT Left 07/24/2016   Procedure: CYSTOSCOPY WITH RETROGRADE PYELOGRAM, URETEROSCOPY AND STENT PLACEMENT and laser lithotripsy;  Surgeon: Festus Aloe, MD;  Location: [facility];  Service: Urology;  Laterality: Left;  . HOLMIUM LASER APPLICATION Left 76/72/0947   Procedure: HOLMIUM LASER APPLICATION;  Surgeon: Festus Aloe, MD;  Location: [facility];  Service: Urology;  Laterality: Left;    Family history:  Family History  Problem Relation Age of Onset  . Asthma Mother   . Hypertension Mother   . Allergic rhinitis Mother   . Cancer Father 60    Prostate Cancer  . Heart disease Maternal Grandmother   . Heart disease Maternal Grandfather   . Stroke Paternal Grandmother   . Heart disease Paternal Grandfather   . Allergic rhinitis Brother   . Angioedema Neg Hx   . Atopy Neg Hx   . Eczema Neg Hx   .  Immunodeficiency Neg Hx   . Urticaria Neg Hx     Social history: Social History   Social History  . Marital status: Married    Spouse name: Ozzie Hoyle   . Number of children: 1  . Years of education: Masters   Occupational History  . HOMEMAKER     Social History Main Topics  . Smoking status: Never Smoker  . Smokeless tobacco: Never Used  . Alcohol use No  . Drug use: No  . Sexual activity: Yes    Partners: Male    Birth control/ protection: None    Social History Narrative   Marital Status:  Married Therapist, art)    Children:  Son  Banker)    Pets: Dog (1)    Living Situation: Lives with husband and son   Occupation: Agricultural engineer    Education: Conservator, museum/gallery in [place] Use/Exposure:  None    Alcohol Use:  Occasional   Drug Use:  None   Diet:  Regular   Exercise:  Walking - once weekly    Hobbies: Baking - Sewing    Right-handed.   2 cups caffeine daily.    Medication List:   Medication List       Accurate as of 08/20/16  5:10 PM. Always use your most recent med list.          acetaminophen 500 MG tablet Commonly known as:  TYLENOL Take 1,000 mg by mouth every 6 (six) hours as needed for mild pain.   HUMIRA PEN 40 MG/0.8ML Pnkt Generic drug:  Adalimumab Inject 40 mg as directed once a week.   adalimumab 40 MG/0.8ML injection Commonly known as:  HUMIRA Inject 40 mg into the skin every 7 (seven) days.   azaTHIOprine 50 MG tablet Commonly known as:  IMURAN Take 100 mg by mouth daily.   azaTHIOprine 50 MG tablet Commonly known as:  IMURAN Take 100 mg by mouth at bedtime.   BEYAZ 3-0.02-0.451 MG tablet Generic drug:  Drospirenone-Ethinyl Estradiol-Levomefol Take 1 tablet by mouth daily.   calcium carbonate 500 MG chewable tablet Commonly known as:  TUMS - dosed in mg elemental calcium Chew 1 tablet by mouth daily as needed for indigestion or heartburn.   cholecalciferol 1000 units tablet Commonly known as:  VITAMIN D Take 2,000 Units by mouth daily.   docusate sodium 100 MG capsule Commonly known as:  COLACE Take 1 capsule (100 mg total) by mouth every 12 (twelve) hours.   fluocinonide cream 0.05 % Commonly known as:  LIDEX Apply 7.35 application topically daily.   folic acid 329 MCG tablet Commonly known as:  FOLVITE Take 3,200 mcg by mouth daily.   LamoTRIgine XR 200 MG Tb24 Take 1 tablet (200 mg total) by mouth at bedtime.   montelukast 10 MG tablet Commonly known as:  SINGULAIR Take 10 mg by mouth every evening.   mupirocin ointment 2  % Commonly known as:  BACTROBAN Apply 2 application topically 2 (two) times daily.   oxyCODONE-acetaminophen 5-325 MG tablet Commonly known as:  PERCOCET/ROXICET Take 1-2 tablets by mouth every 4 (four) hours as needed for severe pain.   PRENATE MINI 29-0.6-0.4-350 MG Caps Take 1 tablet by mouth daily.   sertraline 50 MG tablet Commonly known as:  ZOLOFT Take 75 mg by mouth at bedtime.   SUMAtriptan 100 MG tablet Commonly known as:  IMITREX TK 1/2 TO 1 T PO PRF MIGRAINE. MAY REPEAT 1/2 TO 1 T IN 2  HOURS PRN. NOT TO EXCEED 2 TS IN 24 HOURS.   tamsulosin 0.4 MG Caps capsule Commonly known as:  FLOMAX Take 1 capsule (0.4 mg total) by mouth daily.       Known medication allergies: Allergies  Allergen Reactions  . Promethazine Hcl Other (See Comments)    Pt states that this medication causes hallucinations.      Physical examination: Blood pressure 114/70, pulse 88, temperature 97.8 F (36.6 C), temperature source Oral, resp. rate 16, height 4' 10.5" (1.486 m), weight 137 lb 3.2 oz (62.2 kg), SpO2 99 %, unknown if currently breastfeeding.  General: Alert, interactive, in no acute distress. HEENT: TMs pearly gray, turbinates moderately edematous with clear discharge, post-pharynx non erythematous.  She has dry, crusting at the nasal orifice with some dried blood  Neck: Supple without lymphadenopathy. Lungs: Clear to auscultation without wheezing, rhonchi or rales. {no increased work of breathing. CV: Normal S1, S2 without murmurs. Abdomen: Nondistended, nontender. Skin: Warm and dry, without lesions or rashes. Extremities:  No clubbing, cyanosis or edema. Neuro:   Grossly intact.  Diagnositics/Labs: Imaging: CT max/fac wo contrast 08/11/16 - Mild changes of chronic sinusitis as above. No evidence of acute sinusitis.    Personally reviewed  Allergy testing: Positive for seizure tree,  dust mite on skin prick testing. Intradermal's were positive for molds  Allergy testing  results were read and interpreted by provider, documented by clinical staff.   Assessment and plan:   Allergic rhinoconjunctivitis - Chronic sinusitis changes of the maxillary sinus likely due to allergy - Allergy testing today was positive to [facility] tree, dust mites, molds -Resume use of Singulair 23m daily and Zyrtec 126mdaily.  - Will start Astelin nasal spray 2 sprays each nostril twice a day - Start Pazeo 1 drop each eye as needed for redness/watery/itchy eyes. - Recommend use of nasal saline rinse daily - Advise she keeps the nasal orifice moisturized at all times with anxiety Vaseline, Aloe and would continue with her mupirocin - If she has not well-controlled on medication management will continue course of immunotherapy  Follow-up 2-3 months   I appreciate the opportunity to take part in Mane's care. Please do not hesitate to contact me with questions.  Sincerely,   ShPrudy FeelerMD Allergy/Immunology [facility] [place]

## 2016-08-20 NOTE — Patient Instructions (Addendum)
Allergy testing today was positive to [facility] tree, dust mites  Resume use of Singulair 10mg  daily and Zyrtec 10mg  daily.   Will start Astelin nasal spray 2 sprays each nostril twice a day  Start Pazeo 1 drop each eye as needed for redness/watery/itchy eyes.  Recommend use of nasal saline rinse daily to flush out your nose (may wait until nose is better healed to prevent any stinging/burning  Follow-up 2-3 months

## 2016-09-09 ENCOUNTER — Ambulatory Visit: Payer: Commercial Managed Care - HMO | Admitting: Nurse Practitioner

## 2016-10-20 ENCOUNTER — Ambulatory Visit: Payer: 59 | Admitting: Allergy

## 2016-12-03 ENCOUNTER — Ambulatory Visit (INDEPENDENT_AMBULATORY_CARE_PROVIDER_SITE_OTHER): Payer: 59 | Admitting: Neurology

## 2016-12-03 ENCOUNTER — Encounter: Payer: Self-pay | Admitting: Neurology

## 2016-12-03 VITALS — BP 114/62 | HR 84 | Ht 58.5 in | Wt 138.5 lb

## 2016-12-03 DIAGNOSIS — G43709 Chronic migraine without aura, not intractable, without status migrainosus: Secondary | ICD-10-CM | POA: Diagnosis not present

## 2016-12-03 DIAGNOSIS — R569 Unspecified convulsions: Secondary | ICD-10-CM

## 2016-12-03 DIAGNOSIS — G40209 Localization-related (focal) (partial) symptomatic epilepsy and epileptic syndromes with complex partial seizures, not intractable, without status epilepticus: Secondary | ICD-10-CM | POA: Diagnosis not present

## 2016-12-03 DIAGNOSIS — K50919 Crohn's disease, unspecified, with unspecified complications: Secondary | ICD-10-CM | POA: Diagnosis not present

## 2016-12-03 DIAGNOSIS — IMO0002 Reserved for concepts with insufficient information to code with codable children: Secondary | ICD-10-CM | POA: Insufficient documentation

## 2016-12-03 NOTE — Progress Notes (Signed)
[facility]  PATIENT: MARCELYN Marshall DOB: Dec 09, 1980   HISTORY OF PRESENT ILLNESS:Desiree Marshall, 36 year old female returns for her epilepsy, also reported worsening migraine headaches  She has history of seizures since she was a teenager, partial complex seizures, she would have aura of speech difficulty, confused, hot before she loss of consciousness, going into convulsion.  All the recurrent seizure is due to medications changes or missing dose. She totally had 3 seizures in her life time. Her last seizure was in 2009.   She has been taking brand-name Lamictal XR 200 mg every night, but her insurance would only pay for brand Lamictal starting package, she had healthy delivery of 2 children now at age 89 and 4 while taking Lamictal. She was using higher dose of Lamictal XR 250 mg every night during pregnancy,  Her last Lamictal level was 5.4 06/02/2014 after delivery of baby boy and her dose was reduced from brand Lamictal XR 250mg  qhs, to Lamictal 200 XR Brand every hs. She also has a history of Crohn's disease and is followed at [facility].   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 at age 22 and 21 years old, came in today complains of worsening headache, reported a history of migraine headaches since 1997, used to clustered around her period, about once a month, initially responding well to Aleve, since 2015, she began to have prolonged headaches, was getting prescription of Imitrex 100 mg tablets, works well,  But since July 2017, she had one prolonged episode of migraine headache requiring multiple dose of Imitrex, lasting 4-5 days, in August, she has almost constant low-grade of pressure headaches, she also complains difficulty sleeping sometimes, she had 3 bad headaches in August, that is no longer closely tied to her menstruation, Imitrex still works well for her headaches,  We have personally reviewed MRI of the brain with and without contrast on September fifth  2017 that was essentially normal, she is concerned about her intermittent sharp transient different area of her skull, which was previously associated with her recurrent seizure.  UPDATE December 03 2016: EEG was normal in September 2017, I reviewed a hospital record, she had left distal urethral stone had cystoscopy with left  retrograde pyelogram, left ureteroscopic, laser lithotripsy and left ureteral stent placement  She only has occasionally migraine around her menstruation cycle, no recurrent seizure, she is taking lamotrigine XR generic 200 mg a day  REVIEW OF SYSTEMS: Full 14 system review of systems performed and notable only for those listed, all others are neg:    ALLERGIES: Allergies  Allergen Reactions  . Promethazine Hcl Other (See Comments)    Pt states that this medication causes hallucinations.     HOME MEDICATIONS: Outpatient Medications Prior to Visit  Medication Sig Dispense Refill  . acetaminophen (TYLENOL) 500 MG tablet Take 1,000 mg by mouth every 6 (six) hours as needed for mild pain.     Marland Kitchen azaTHIOprine (IMURAN) 50 MG tablet Take 100 mg by mouth at bedtime.     . Azelastine HCl 0.15 % SOLN Place 2 sprays into both nostrils 2 (two) times daily. 30 mL 5  . calcium carbonate (TUMS - DOSED IN MG ELEMENTAL CALCIUM) 500 MG chewable tablet Chew 1 tablet by mouth daily as needed for indigestion or heartburn.    . cholecalciferol (VITAMIN D) 1000 UNITS tablet Take 2,000 Units by mouth daily.     . fluocinonide cream (LIDEX) 0.05 % Apply 0.05 application topically daily.    Marland Kitchen  folic acid (FOLVITE) 400 MCG tablet Take 3,200 mcg by mouth daily.     . LamoTRIgine XR 200 MG TB24 Take 1 tablet (200 mg total) by mouth at bedtime. 90 tablet 4  . montelukast (SINGULAIR) 10 MG tablet Take 10 mg by mouth every evening.     . sertraline (ZOLOFT) 50 MG tablet Take 75 mg by mouth at bedtime.     . SUMAtriptan (IMITREX) 100 MG tablet TK 1/2 TO 1 T PO PRF MIGRAINE. MAY REPEAT 1/2 TO 1 T IN 2  HOURS PRN. NOT TO EXCEED 2 TS IN 24 HOURS.  5  . HUMIRA PEN 40 MG/0.8ML PNKT Inject 40 mg as directed once a week.    Marland Kitchen adalimumab (HUMIRA) 40 MG/0.8ML injection Inject 40 mg into the skin every 7 (seven) days.     Marland Kitchen azaTHIOprine (IMURAN) 50 MG tablet Take 100 mg by mouth daily.    Marland Kitchen BEYAZ 3-0.02-0.451 MG tablet Take 1 tablet by mouth daily.     Marland Kitchen docusate sodium (COLACE) 100 MG capsule Take 1 capsule (100 mg total) by mouth every 12 (twelve) hours. (Patient not taking: Reported on 08/20/2016) 60 capsule 0  . mupirocin ointment (BACTROBAN) 2 % Apply 2 application topically 2 (two) times daily.    . Olopatadine HCl (PAZEO) 0.7 % SOLN Place 1 drop into both eyes 1 day or 1 dose. 1 Bottle 5  . oxyCODONE-acetaminophen (PERCOCET/ROXICET) 5-325 MG tablet Take 1-2 tablets by mouth every 4 (four) hours as needed for severe pain. (Patient not taking: Reported on 08/20/2016) 15 tablet 0  . Prenat w/o A-FeCbn-Meth-FA-DHA (PRENATE MINI) 29-0.6-0.4-350 MG CAPS Take 1 tablet by mouth daily.     . tamsulosin (FLOMAX) 0.4 MG CAPS capsule Take 1 capsule (0.4 mg total) by mouth daily. (Patient not taking: Reported on 08/20/2016) 30 capsule 0   No facility-administered medications prior to visit.     PAST MEDICAL HISTORY: Past Medical History:  Diagnosis Date  . Anemia    Resolved  . Astigmatism   . Crohn's disease (HCC)   . Eczema   . Epilepsy (HCC)    Diagnosed at the age of 54.   . Kidney stone   . Migraine     PAST SURGICAL HISTORY: Past Surgical History:  Procedure Laterality Date  . CESAREAN SECTION N/A 05/09/2014   Procedure: CESAREAN SECTION;  Surgeon: Loney Laurence, MD;  Location: [facility];  Service: Obstetrics;  Laterality: N/A;  . CYSTOSCOPY WITH RETROGRADE PYELOGRAM, URETEROSCOPY AND STENT PLACEMENT Left 07/24/2016   Procedure: CYSTOSCOPY WITH RETROGRADE PYELOGRAM, URETEROSCOPY AND STENT PLACEMENT and laser lithotripsy;  Surgeon: Jerilee Field, MD;  Location: [facility];  Service:  Urology;  Laterality: Left;  . HOLMIUM LASER APPLICATION Left 07/24/2016   Procedure: HOLMIUM LASER APPLICATION;  Surgeon: Jerilee Field, MD;  Location: [facility];  Service: Urology;  Laterality: Left;    FAMILY HISTORY: Family History  Problem Relation Age of Onset  . Asthma Mother   . Hypertension Mother   . Allergic rhinitis Mother   . Cancer Father 67    Prostate Cancer  . Heart disease Maternal Grandmother   . Heart disease Maternal Grandfather   . Stroke Paternal Grandmother   . Heart disease Paternal Grandfather   . Allergic rhinitis Brother   . Angioedema Neg Hx   . Atopy Neg Hx   . Eczema Neg Hx   . Immunodeficiency Neg Hx   . Urticaria Neg Hx     SOCIAL HISTORY: Social History  Social History  . Marital status: Married    Spouse name: Daron Offer   . Number of children: 1  . Years of education: Masters   Occupational History  . HOMEMAKER     Social History Main Topics  . Smoking status: Never Smoker  . Smokeless tobacco: Never Used  . Alcohol use No  . Drug use: No  . Sexual activity: Yes    Partners: Male    Birth control/ protection: None   Other Topics Concern  . Not on file   Social History Narrative   Marital Status:  Married Estate agent)    Children:  Son Corporate treasurer)    Pets: Dog (1)    Living Situation: Lives with husband and son   Occupation: Futures trader    Education: Manufacturing engineer in Nursing Administration       Tobacco Use/Exposure:  None    Alcohol Use:  Occasional   Drug Use:  None   Diet:  Regular   Exercise:  Walking - once weekly    Hobbies: Baking - Sewing    Right-handed.   2 cups caffeine daily.                 PHYSICAL EXAM  Vitals:   12/03/16 0939  BP: 114/62  Pulse: 84  Weight: 138 lb 8 oz (62.8 kg)  Height: 4' 10.5" (1.486 m)   Body mass index is 28.45 kg/m. Generalized: Well developed, in no acute distress  Head: normocephalic and atraumatic,. Oropharynx benign  Neck: Supple, no carotid bruits  Cardiac:  Regular rate rhythm, no murmur  Musculoskeletal: No deformity   Neurological examination   Mentation: Alert oriented to time, place, history taking. Follows all commands speech and language fluent  Cranial nerve II-XII: Pupils were equal round reactive to light extraocular movements were full, visual field were full on confrontational test. Facial sensation and strength were normal. hearing was intact to finger rubbing bilaterally. Uvula tongue midline. head turning and shoulder shrug were normal and symmetric.Tongue protrusion into cheek strength was normal. Motor: normal bulk and tone, full strength in the BUE, BLE, No focal weakness Coordination: finger-nose-finger, heel-to-shin bilaterally, no dysmetria Reflexes: Brachioradialis 2/2, biceps 2/2, triceps 2/2, patellar 2/2, Achilles 2/2, plantar responses were flexor bilaterally. Gait and Station: Rising up from seated position without assistance, normal stance, moderate stride, good arm swing, smooth turning, able to perform tiptoe, and heel walking without difficulty. Tandem gait is steady  DIAGNOSTIC DATA (LABS, IMAGING, TESTING) -   ASSESSMENT AND PLAN 36 y.o. year old female    Complex partial seizure, last recurrent seizure was 2009  Normal MRI of the brain in September 2017, other than borderline Arnold-Chiari malformation  EEG was normal in September 2017   Continue lamotrigine xr, switch to equivalent generic 200 mg every night   Check lamotrigine trough level to serve her baseline if she gets pregnant  Worsening migraine headaches  Imitrex as needed Crohn's disease  On Imuran 50 mg 2 tablets every night     Levert Feinstein, M.D. Ph.D.  [facility] [address] [place], [place] [address] Phone: [phone] Fax:      [phone]  CC: Mosetta Putt, MD

## 2016-12-09 ENCOUNTER — Other Ambulatory Visit: Payer: Self-pay | Admitting: Neurology

## 2016-12-10 ENCOUNTER — Telehealth: Payer: Self-pay | Admitting: *Deleted

## 2016-12-10 LAB — LAMOTRIGINE LEVEL: LAMOTRIGINE LVL: 5 ug/mL (ref 2.0–20.0)

## 2016-12-10 NOTE — Telephone Encounter (Signed)
Results received by fax from Eugenio Saenz (collected on 12/09/16):  Lamotrigine, serum: 5.0

## 2016-12-22 ENCOUNTER — Telehealth: Payer: Self-pay | Admitting: *Deleted

## 2016-12-22 NOTE — Telephone Encounter (Signed)
[employer] needs info from pt - she will call them

## 2016-12-22 NOTE — Telephone Encounter (Signed)
Pt would like a return call

## 2017-05-21 ENCOUNTER — Telehealth: Payer: Self-pay | Admitting: Neurology

## 2017-05-21 NOTE — Telephone Encounter (Signed)
Spoke to patient - she has scheduled a yearly follow up for 12/07/17.

## 2017-05-21 NOTE — Telephone Encounter (Signed)
Patient called office in reference to see if she is needing to schedule a yearly with Dr. Krista Blue.  Patient last seen 3//2018 stating follow up as needed patient advised, but would still like to know if she needs to schedule.  Please call

## 2017-08-11 ENCOUNTER — Other Ambulatory Visit: Payer: Self-pay | Admitting: Neurology

## 2017-08-13 ENCOUNTER — Other Ambulatory Visit: Payer: Self-pay | Admitting: Family Medicine

## 2017-08-14 ENCOUNTER — Other Ambulatory Visit: Payer: Self-pay | Admitting: Family Medicine

## 2017-08-14 DIAGNOSIS — R519 Headache, unspecified: Secondary | ICD-10-CM

## 2017-08-14 DIAGNOSIS — R51 Headache: Principal | ICD-10-CM

## 2017-08-17 ENCOUNTER — Ambulatory Visit
Admission: RE | Admit: 2017-08-17 | Discharge: 2017-08-17 | Disposition: A | Discharge status: Home / Self Care | Payer: Managed Care, Other (non HMO) | Source: Ambulatory Visit | Attending: Family Medicine | Admitting: Family Medicine

## 2017-08-17 DIAGNOSIS — R51 Headache: Principal | ICD-10-CM

## 2017-08-17 DIAGNOSIS — R519 Headache, unspecified: Secondary | ICD-10-CM

## 2017-08-18 ENCOUNTER — Encounter: Payer: Self-pay | Admitting: Neurology

## 2017-11-03 ENCOUNTER — Encounter: Payer: Self-pay | Admitting: Neurology

## 2017-11-03 ENCOUNTER — Ambulatory Visit (INDEPENDENT_AMBULATORY_CARE_PROVIDER_SITE_OTHER): Payer: 59 | Admitting: Neurology

## 2017-11-03 VITALS — BP 110/68 | HR 92 | Ht 59.0 in | Wt 138.0 lb

## 2017-11-03 DIAGNOSIS — G43829 Menstrual migraine, not intractable, without status migrainosus: Secondary | ICD-10-CM

## 2017-11-03 DIAGNOSIS — G40209 Localization-related (focal) (partial) symptomatic epilepsy and epileptic syndromes with complex partial seizures, not intractable, without status epilepticus: Secondary | ICD-10-CM

## 2017-11-03 NOTE — Patient Instructions (Signed)
1. Continue as needed Imitrex for migraines 2. Continue Lamictal XR 232m daily 3. Call our office for any change in symptoms 4. Follow-up in 5 months, call for any changes. We will be checking your Lamictal level every month when you get pregnant  Seizure Precautions: 1. If medication has been prescribed for you to prevent seizures, take it exactly as directed.  Do not stop taking the medicine without talking to your doctor first, even if you have not had a seizure in a long time.   2. Avoid activities in which a seizure would cause danger to yourself or to others.  Don't operate dangerous machinery, swim alone, or climb in high or dangerous places, such as on ladders, roofs, or girders.  Do not drive unless your doctor says you may.  3. If you have any warning that you may have a seizure, lay down in a safe place where you can't hurt yourself.    4.  No driving for 6 months from last seizure, as [facility]   Please refer to the following link on the [place] for more information: http://[email or web address]/[email or web address]/drivingu.cfm   5.  Maintain good sleep hygiene. Avoid alcohol.  6.  Notify your neurology if you are planning pregnancy or if you become pregnant.  7.  Contact your doctor if you have any problems that may be related to the medicine you are taking.  8.  Call 911 and bring the patient back to the ED if:        A.  The seizure lasts longer than 5 minutes.       B.  The patient doesn't awaken shortly after the seizure  C.  The patient has new problems such as difficulty seeing, speaking or moving  D.  The patient was injured during the seizure  E.  The patient has a temperature over 102 F (39C)  F.  The patient vomited and now is having trouble breathing

## 2017-11-03 NOTE — Progress Notes (Signed)
NEUROLOGY CONSULTATION NOTE  Desiree Marshall MRN: [id number] DOB: 1980/11/14  Referring provider: Dr. Derinda Late Primary care provider: Dr. Derinda Late  Reason for consult:  Migraines, epilepsy  Dear Dr Sandi Mariscal:  Thank you for your kind referral of Desiree Marshall for consultation of the above symptoms. Although her history is well known to you, please allow me to reiterate it for the purpose of our medical record. Her 2 young sons are in the office with her today. Records and images were personally reviewed where available.  HISTORY OF PRESENT ILLNESS: This is a pleasant 37 year old right-handed woman with a history of Crohn's disease presenting for evaluation of migraines and seizures. She had been seeing neurologist Dr. Krista Blue, records were reviewed. She started having bad migraines in high school, usually with left frontal pressure. She used to have nausea/vomiting, none since having Imitrex available. No photo/phonophobia. They usually occur with her menstrual period, she usually gets 3-4 migraines a month during the time of her menses. No side effects on Imitrex. She is concerned about a different type of pain this past year, when she laughs, sneezes, or bears down, she would have a brief diffuse sharp pain in her head like she is dizzy, then goes away after a second. There is no associated confusion, focal numbness/tingling/weakness, nausea/vomiting, visual obscurations. Poor sleep is a trigger for her migraines. No family history of migraines. She denies any tinnitus, diplopia, dysarthria/dysphagia, neck pain. She has occasional dizziness upon standing a couple of times a month lasting a few minutes. She usually gets 6-7 hours of sleep. No history of head injuries.   Seizures started in her teens, she would have speech difficulties where she thinks a word but can't say it. She would feel "foggy brain," then have a generalized convulsion. She has had only 3 seizures in her lifetime, the  second occurred when she was tried to wean off Tegretol, and the last seizure occurred in 2009 when she missed a dose of Lamotrigine. She is taking Lamotrigine XR 281m daily without side effects.  She denies any olfactory/gustatory hallucinations, deja vu, rising epigastric sensation, focal numbness/tingling/weakness, myoclonic jerks.  Epilepsy Risk Factors:  She had a normal birth and early development.  There is no history of febrile convulsions, CNS infections such as meningitis/encephalitis, significant traumatic brain injury, neurosurgical procedures, or family history of seizures.  Diagnostic Data: EEG in 04/2016 was normal. I personally reviewed MRI brain with and without contrast done 05/2016 which showed borderline Chiari 1 malformation (533mbelow foramen magnum), otherwise normal MRI brain, hippocampi symmetric with no abnormal signal or enhancement seen. Lamictal level 11/2016: 5.0  PAST MEDICAL HISTORY: Past Medical History:  Diagnosis Date  . Anemia    Resolved  . Astigmatism   . Crohn's disease ([facility]  . Eczema   . Epilepsy ([facility]   Diagnosed at the age of 37  . Kidney stone   . Migraine     PAST SURGICAL HISTORY: Past Surgical History:  Procedure Laterality Date  . CESAREAN SECTION N/A 05/09/2014   Procedure: CESAREAN SECTION;  Surgeon: MiDaria PasturesMD;  [facility];  Service: Obstetrics;  Laterality: N/A;  . CYSTOSCOPY WITH RETROGRADE PYELOGRAM, URETEROSCOPY AND STENT PLACEMENT Left 07/24/2016   Procedure: CYSTOSCOPY WITH RETROGRADE PYELOGRAM, URETEROSCOPY AND STENT PLACEMENT and laser lithotripsy;  Surgeon: MaFestus AloeMD;  Location[facility];  Service: Urology;  Laterality: Left;  . HOLMIUM LASER APPLICATION Left 1071/02/2693 Procedure: HOLMIUM LASER APPLICATION;  Surgeon: Festus Aloe, MD;  Location: [facility];  Service: Urology;  Laterality: Left;    MEDICATIONS: Current Outpatient Medications on File Prior to Visit  Medication Sig Dispense  Refill  . acetaminophen (TYLENOL) 500 MG tablet Take 1,000 mg by mouth every 6 (six) hours as needed for mild pain.     Marland Kitchen azaTHIOprine (IMURAN) 50 MG tablet Take 100 mg by mouth at bedtime.     . Azelastine HCl 0.15 % SOLN Place 2 sprays into both nostrils 2 (two) times daily. 30 mL 5  . calcium carbonate (TUMS - DOSED IN MG ELEMENTAL CALCIUM) 500 MG chewable tablet Chew 1 tablet by mouth daily as needed for indigestion or heartburn.    . cholecalciferol (VITAMIN D) 1000 UNITS tablet Take 2,000 Units by mouth daily.     . fluocinonide cream (LIDEX) 0.05 % Apply 0.30 application topically daily.    . folic acid (FOLVITE) 092 MCG tablet Take 3,200 mcg by mouth daily.     . LamoTRIgine 200 MG TB24 24 hour tablet TAKE 1 TABLET AT BEDTIME 90 tablet 1  . montelukast (SINGULAIR) 10 MG tablet Take 10 mg by mouth every evening.     . sertraline (ZOLOFT) 50 MG tablet Take 75 mg by mouth at bedtime.     . SUMAtriptan (IMITREX) 100 MG tablet TK 1/2 TO 1 T PO PRF MIGRAINE. MAY REPEAT 1/2 TO 1 T IN 2 HOURS PRN. NOT TO EXCEED 2 TS IN 24 HOURS.  5  . InFLIXimab (REMICADE IV) Remicade     No current facility-administered medications on file prior to visit.     ALLERGIES: Allergies  Allergen Reactions  . Promethazine Hcl Other (See Comments)    Pt states that this medication causes hallucinations.     FAMILY HISTORY: Family History  Problem Relation Age of Onset  . Asthma Mother   . Hypertension Mother   . Allergic rhinitis Mother   . Cancer Father 54       Prostate Cancer  . Heart disease Maternal Grandmother   . Heart disease Maternal Grandfather   . Stroke Paternal Grandmother   . Heart disease Paternal Grandfather   . Allergic rhinitis Brother   . Angioedema Neg Hx   . Atopy Neg Hx   . Eczema Neg Hx   . Immunodeficiency Neg Hx   . Urticaria Neg Hx     SOCIAL HISTORY: Social History   Socioeconomic History  . Marital status: Married    Spouse name: Ozzie Hoyle   . Number of children: 1    . Years of education: Masters  . Highest education level: Not on file  Social Needs  . Financial resource strain: Not on file  . Food insecurity - worry: Not on file  . Food insecurity - inability: Not on file  . Transportation needs - medical: Not on file  . Transportation needs - non-medical: Not on file  Occupational History  . Occupation: HOMEMAKER   Tobacco Use  . Smoking status: Never Smoker  . Smokeless tobacco: Never Used  Substance and Sexual Activity  . Alcohol use: No  . Drug use: No  . Sexual activity: Yes    Partners: Male    Birth control/protection: None  Other Topics Concern  . Not on file  Social History Narrative   Marital Status:  Married Therapist, art)    Children:  2 Sons (Clifford and Cletus Gash)    Pets: Dog (1)    Living Situation: Lives with husband and son  Occupation: Agricultural engineer    Education: Conservator, museum/gallery in [place] Use/Exposure:  None    Alcohol Use:  Occasional   Drug Use:  None   Diet:  Regular   Exercise:  Walking - once weekly    Hobbies: Baking - Sewing    Right-handed.   2 cups caffeine daily.             REVIEW OF SYSTEMS: Constitutional: No fevers, chills, or sweats, no generalized fatigue, change in appetite Eyes: No visual changes, double vision, eye pain Ear, nose and throat: No hearing loss, ear pain, nasal congestion, sore throat Cardiovascular: No chest pain, palpitations Respiratory:  No shortness of breath at rest or with exertion, wheezes GastrointestinaI: No nausea, vomiting, diarrhea, abdominal pain, fecal incontinence Genitourinary:  No dysuria, urinary retention or frequency Musculoskeletal:  No neck pain, back pain Integumentary: No rash, pruritus, skin lesions Neurological: as above Psychiatric: No depression, insomnia, anxiety Endocrine: No palpitations, fatigue, diaphoresis, mood swings, change in appetite, change in weight, increased thirst Hematologic/Lymphatic:  No anemia, purpura,  petechiae. Allergic/Immunologic: no itchy/runny eyes, nasal congestion, recent allergic reactions, rashes  PHYSICAL EXAM: Vitals:   11/03/17 1039  BP: 110/68  Pulse: 92  SpO2: 98%   General: No acute distress Head:  Normocephalic/atraumatic Eyes: Fundoscopic exam shows bilateral sharp discs, no vessel changes, exudates, or hemorrhages Neck: supple, no paraspinal tenderness, full range of motion Back: No paraspinal tenderness Heart: regular rate and rhythm Lungs: Clear to auscultation bilaterally. Vascular: No carotid bruits. Skin/Extremities: No rash, no edema Neurological Exam: Mental status: alert and oriented to person, place, and time, no dysarthria or aphasia, Fund of knowledge is appropriate.  Recent and remote memory are intact.  Attention and concentration are normal.    Able to name objects and repeat phrases. Cranial nerves: CN I: not tested CN II: pupils equal, round and reactive to light, visual fields intact, fundi unremarkable. CN III, IV, VI:  full range of motion, no nystagmus, no ptosis CN V: facial sensation intact CN VII: upper and lower face symmetric CN VIII: hearing intact to finger rub CN IX, X: gag intact, uvula midline CN XI: sternocleidomastoid and trapezius muscles intact CN XII: tongue midline Bulk & Tone: normal, no fasciculations. Motor: 5/5 throughout with no pronator drift. Sensation: intact to light touch, cold, pin, vibration and joint position sense.  No extinction to double simultaneous stimulation.  Romberg test negative Deep Tendon Reflexes: +2 throughout, no ankle clonus Plantar responses: downgoing bilaterally Cerebellar: no incoordination on finger to nose, heel to shin. No dysdiadochokinesia Gait: narrow-based and steady, able to tandem walk adequately. 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 She has had only 3 seizures in her  lifetime, well-controlled on Lamotrigine XR 231m daily with no side effects, last seizure was in 2009. She has catamenial migraines, we discussed option to start a preventative medication around the time of her menstrual period since her periods are regular, she feels fine with prn Imitrex for now. She was advised to keep a calendar of her migraines. They are planning for pregnancy in April, we discussed issues in women with epilepsy, we will need to check Lamictal level every month while pregnant. [place] driving laws were discussed with the patient, and she knows to stop driving after a seizure, until 6 months seizure-free. She will follow-up in 5 months and knows to call for  any changes.   Thank you for allowing me to participate in the care of this patient. Please do not hesitate to call for any questions or concerns.   Ellouise Newer, M.D.  CC: Dr. Sandi Mariscal

## 2017-11-10 ENCOUNTER — Encounter: Payer: Self-pay | Admitting: Neurology

## 2017-12-07 ENCOUNTER — Ambulatory Visit: Payer: Self-pay | Admitting: Neurology

## 2017-12-09 ENCOUNTER — Telehealth: Payer: Self-pay

## 2017-12-09 NOTE — Telephone Encounter (Signed)
Pt called and says that she missed a dose of Lamictal last night.  She took it as soon as she remembered - about 7 hours after she usually takes it.  She is concerned about having a seizure.  Advised that a delay in 1 dose should not increase her chance of a seizure by much.  Advised pt to continue her regular routine with Lamictal.  Pt asked about driving.  I let her know that if does not have a seizure, she can still drive.  However, if she wants to be extra cautious, wait until she takes next dose before driving.

## 2017-12-14 ENCOUNTER — Ambulatory Visit: Payer: Self-pay | Admitting: Neurology

## 2018-01-08 ENCOUNTER — Encounter (HOSPITAL_COMMUNITY): Payer: Self-pay | Admitting: *Deleted

## 2018-01-08 ENCOUNTER — Inpatient Hospital Stay (HOSPITAL_COMMUNITY): Payer: 59

## 2018-01-08 ENCOUNTER — Inpatient Hospital Stay (HOSPITAL_COMMUNITY)
Admission: AD | Admit: 2018-01-08 | Discharge: 2018-01-08 | Disposition: A | Discharge status: Home / Self Care | Payer: 59 | Source: Ambulatory Visit | Attending: Obstetrics and Gynecology | Admitting: Obstetrics and Gynecology

## 2018-01-08 DIAGNOSIS — O4690 Antepartum hemorrhage, unspecified, unspecified trimester: Secondary | ICD-10-CM

## 2018-01-08 DIAGNOSIS — O26851 Spotting complicating pregnancy, first trimester: Secondary | ICD-10-CM | POA: Diagnosis not present

## 2018-01-08 DIAGNOSIS — O3680X0 Pregnancy with inconclusive fetal viability, not applicable or unspecified: Secondary | ICD-10-CM

## 2018-01-08 DIAGNOSIS — O283 Abnormal ultrasonic finding on antenatal screening of mother: Secondary | ICD-10-CM

## 2018-01-08 DIAGNOSIS — O34219 Maternal care for unspecified type scar from previous cesarean delivery: Secondary | ICD-10-CM | POA: Insufficient documentation

## 2018-01-08 DIAGNOSIS — Z3A01 Less than 8 weeks gestation of pregnancy: Secondary | ICD-10-CM | POA: Diagnosis not present

## 2018-01-08 LAB — WET PREP, GENITAL
CLUE CELLS WET PREP: NONE SEEN
Sperm: NONE SEEN
TRICH WET PREP: NONE SEEN
YEAST WET PREP: NONE SEEN

## 2018-01-08 LAB — CBC
HEMATOCRIT: 37.9 % (ref 36.0–46.0)
Hemoglobin: 12.4 g/dL (ref 12.0–15.0)
MCH: 29.6 pg (ref 26.0–34.0)
MCHC: 32.7 g/dL (ref 30.0–36.0)
MCV: 90.5 fL (ref 78.0–100.0)
PLATELETS: 387 10*3/uL (ref 150–400)
RBC: 4.19 MIL/uL (ref 3.87–5.11)
RDW: 13.9 % (ref 11.5–15.5)
WBC: 7.1 10*3/uL (ref 4.0–10.5)

## 2018-01-08 LAB — URINALYSIS, ROUTINE W REFLEX MICROSCOPIC
Bilirubin Urine: NEGATIVE
GLUCOSE, UA: NEGATIVE mg/dL
Ketones, ur: NEGATIVE mg/dL
NITRITE: NEGATIVE
PH: 5 (ref 5.0–8.0)
PROTEIN: NEGATIVE mg/dL
SPECIFIC GRAVITY, URINE: 1.026 (ref 1.005–1.030)

## 2018-01-08 LAB — OB RESULTS CONSOLE GC/CHLAMYDIA: Gonorrhea: NEGATIVE

## 2018-01-08 LAB — POCT PREGNANCY, URINE: Preg Test, Ur: POSITIVE — AB

## 2018-01-08 LAB — HCG, QUANTITATIVE, PREGNANCY: hCG, Beta Chain, Quant, S: 537 m[IU]/mL — ABNORMAL HIGH (ref <5)

## 2018-01-08 NOTE — Discharge Instructions (Signed)
Vaginal Bleeding During Pregnancy, First Trimester °A small amount of bleeding (spotting) from the vagina is common in early pregnancy. Sometimes the bleeding is normal and is not a problem, and sometimes it is a sign of something serious. Be sure to tell your doctor about any bleeding from your vagina right away. °Follow these instructions at home: °· Watch your condition for any changes. °· Follow your doctor's instructions about how active you can be. °· If you are on bed rest: °? You may need to stay in bed and only get up to use the bathroom. °? You may be allowed to do some activities. °? If you need help, make plans for someone to help you. °· Write down: °? The number of pads you use each day. °? How often you change pads. °? How soaked (saturated) your pads are. °· Do not use tampons. °· Do not douche. °· Do not have sex or orgasms until your doctor says it is okay. °· If you pass any tissue from your vagina, save the tissue so you can show it to your doctor. °· Only take medicines as told by your doctor. °· Do not take aspirin because it can make you bleed. °· Keep all follow-up visits as told by your doctor. °Contact a doctor if: °· You bleed from your vagina. °· You have cramps. °· You have labor pains. °· You have a fever that does not go away after you take medicine. °Get help right away if: °· You have very bad cramps in your back or belly (abdomen). °· You pass large clots or tissue from your vagina. °· You bleed more. °· You feel light-headed or weak. °· You pass out (faint). °· You have chills. °· You are leaking fluid or have a gush of fluid from your vagina. °· You pass out while pooping (having a bowel movement). °This information is not intended to replace advice given to you by your health care provider. Make sure you discuss any questions you have with your health care provider. °Document Released: 01/30/2014 Document Revised: 02/21/2016 Document Reviewed: 05/23/2013 °Elsevier Interactive  Patient Education © 2018 [employer] ° °

## 2018-01-08 NOTE — MAU Note (Signed)
Pt states she is [redacted] weeks pregnant, started light bleeding this morning.  Called [facility], was told to come to MAU.  Also C/O cramping throughout pregnancy, more intense today.

## 2018-01-08 NOTE — MAU Provider Note (Signed)
History     CSN: [id number]  Arrival date and time: 01/08/18 1210  Chief Complaint  Patient presents with  . Vaginal Bleeding  . Abdominal Pain   [id number] @[redacted]w[redacted]d  by sure LMP here with spotting. First noticed this am. Describes as small amt, pink and brown. Having some mild cramping over the last 2 weeks. Rates 1/10. Hasn't taken anything.    OB History    Gravida  3   Para  2   Term  1   Preterm  1   AB  0   Living  2     SAB  0   TAB  0   Ectopic  0   Multiple  0   Live Births  2           Past Medical History:  Diagnosis Date  . Anemia    Resolved  . Astigmatism   . Crohn's disease (HCC)   . Eczema   . Epilepsy (HCC)    Diagnosed at the age of 34.   . Kidney stone   . Migraine     Past Surgical History:  Procedure Laterality Date  . CESAREAN SECTION N/A 05/09/2014   Procedure: CESAREAN SECTION;  Surgeon: Loney Laurence, MD;  Location: [facility];  Service: Obstetrics;  Laterality: N/A;  . CYSTOSCOPY WITH RETROGRADE PYELOGRAM, URETEROSCOPY AND STENT PLACEMENT Left 07/24/2016   Procedure: CYSTOSCOPY WITH RETROGRADE PYELOGRAM, URETEROSCOPY AND STENT PLACEMENT and laser lithotripsy;  Surgeon: Jerilee Field, MD;  Location: [facility];  Service: Urology;  Laterality: Left;  . HOLMIUM LASER APPLICATION Left 07/24/2016   Procedure: HOLMIUM LASER APPLICATION;  Surgeon: Jerilee Field, MD;  Location: [facility];  Service: Urology;  Laterality: Left;    Family History  Problem Relation Age of Onset  . Asthma Mother   . Hypertension Mother   . Allergic rhinitis Mother   . Cancer Father 3       Prostate Cancer  . Heart disease Maternal Grandmother   . Heart disease Maternal Grandfather   . Stroke Paternal Grandmother   . Heart disease Paternal Grandfather   . Allergic rhinitis Brother   . Angioedema Neg Hx   . Atopy Neg Hx   . Eczema Neg Hx   . Immunodeficiency Neg Hx   . Urticaria Neg Hx     Social History   Tobacco Use  . Smoking status:  Never Smoker  . Smokeless tobacco: Never Used  Substance Use Topics  . Alcohol use: No  . Drug use: No    Allergies:  Allergies  Allergen Reactions  . Promethazine Hcl Other (See Comments)    Pt states that this medication causes hallucinations.     Medications Prior to Admission  Medication Sig Dispense Refill Last Dose  . acetaminophen (TYLENOL) 500 MG tablet Take 1,000 mg by mouth every 6 (six) hours as needed for mild pain.    Taking  . azaTHIOprine (IMURAN) 50 MG tablet Take 100 mg by mouth at bedtime.    Taking  . Azelastine HCl 0.15 % SOLN Place 2 sprays into both nostrils 2 (two) times daily. 30 mL 5 Taking  . calcium carbonate (TUMS - DOSED IN MG ELEMENTAL CALCIUM) 500 MG chewable tablet Chew 1 tablet by mouth daily as needed for indigestion or heartburn.   Taking  . cholecalciferol (VITAMIN D) 1000 UNITS tablet Take 2,000 Units by mouth daily.    Taking  . fluocinonide cream (LIDEX) 0.05 % Apply 0.05 application topically daily.  Taking  . folic acid (FOLVITE) 400 MCG tablet Take 3,200 mcg by mouth daily.    Taking  . InFLIXimab (REMICADE IV) Remicade     . LamoTRIgine 200 MG TB24 24 hour tablet TAKE 1 TABLET AT BEDTIME 90 tablet 1 Taking  . montelukast (SINGULAIR) 10 MG tablet Take 10 mg by mouth every evening.    Taking  . sertraline (ZOLOFT) 50 MG tablet Take 75 mg by mouth at bedtime.    Taking  . SUMAtriptan (IMITREX) 100 MG tablet TK 1/2 TO 1 T PO PRF MIGRAINE. MAY REPEAT 1/2 TO 1 T IN 2 HOURS PRN. NOT TO EXCEED 2 TS IN 24 HOURS.  5 Taking    Review of Systems  Gastrointestinal: Positive for abdominal pain.  Genitourinary: Positive for vaginal bleeding.   Physical Exam   Blood pressure 100/61, pulse 91, temperature 98.4 F (36.9 C), temperature source Oral, resp. rate 16, height 4\' 11"  (1.499 m), weight 144 lb (65.3 kg), last menstrual period 12/11/2017, unknown if currently breastfeeding.  Physical Exam  Constitutional: She is oriented to person, place, and  time. She appears well-developed and well-nourished. No distress.  HENT:  Head: Normocephalic and atraumatic.  Neck: Normal range of motion.  Cardiovascular: Normal rate.  Respiratory: Effort normal. No respiratory distress.  GI: Soft. She exhibits no distension and no mass. There is no tenderness. There is no rebound and no guarding.  Genitourinary:  Genitourinary Comments: External: no lesions or erythema Vagina: rugated, pink, moist, scant amt bloody discharge Uterus: non enlarged, anteverted, non tender, no CMT Adnexae: no masses, no tenderness left, no tenderness right Cervix closed/long   Musculoskeletal: Normal range of motion.  Neurological: She is alert and oriented to person, place, and time.  Skin: Skin is warm and dry.  Psychiatric: She has a normal mood and affect.   Results for orders placed or performed during the hospital encounter of 01/08/18 (from the past 24 hour(s))  Urinalysis, Routine w reflex microscopic     Status: Abnormal   Collection Time: 01/08/18 12:36 PM  Result Value Ref Range   Color, Urine AMBER (A) YELLOW   APPearance HAZY (A) CLEAR   Specific Gravity, Urine 1.026 1.005 - 1.030   pH 5.0 5.0 - 8.0   Glucose, UA NEGATIVE NEGATIVE mg/dL   Hgb urine dipstick LARGE (A) NEGATIVE   Bilirubin Urine NEGATIVE NEGATIVE   Ketones, ur NEGATIVE NEGATIVE mg/dL   Protein, ur NEGATIVE NEGATIVE mg/dL   Nitrite NEGATIVE NEGATIVE   Leukocytes, UA TRACE (A) NEGATIVE   RBC / HPF 6-30 0 - 5 RBC/hpf   WBC, UA 0-5 0 - 5 WBC/hpf   Bacteria, UA FEW (A) NONE SEEN   Squamous Epithelial / LPF 6-30 (A) NONE SEEN   Mucus PRESENT   Pregnancy, urine POC     Status: Abnormal   Collection Time: 01/08/18  1:04 PM  Result Value Ref Range   Preg Test, Ur POSITIVE (A) NEGATIVE  CBC     Status: None   Collection Time: 01/08/18  2:39 PM  Result Value Ref Range   WBC 7.1 4.0 - 10.5 K/uL   RBC 4.19 3.87 - 5.11 MIL/uL   Hemoglobin 12.4 12.0 - 15.0 g/dL   HCT 16.1 09.6 - 04.5 %    MCV 90.5 78.0 - 100.0 fL   MCH 29.6 26.0 - 34.0 pg   MCHC 32.7 30.0 - 36.0 g/dL   RDW 40.9 81.1 - 91.4 %   Platelets 387 150 - 400 K/uL  hCG, quantitative, pregnancy     Status: Abnormal   Collection Time: 01/08/18  2:39 PM  Result Value Ref Range   hCG, Beta Chain, Quant, S 537 (H) <5 mIU/mL  Wet prep, genital     Status: Abnormal   Collection Time: 01/08/18  3:35 PM  Result Value Ref Range   Yeast Wet Prep HPF POC NONE SEEN NONE SEEN   Trich, Wet Prep NONE SEEN NONE SEEN   Clue Cells Wet Prep HPF POC NONE SEEN NONE SEEN   WBC, Wet Prep HPF POC MODERATE (A) NONE SEEN   Sperm NONE SEEN    US Ob Less Than 14 Weeks With Ob Transvaginal  Result Date: 01/08/2018 CLINICAL DATA:  Vaginal bleeding. Gestational age by LMP of 4 weeks 0 days. EXAM: OBSTETRIC <14 WK [place] AND TRANSVAGINAL OB US TECHNIQUE: Both transabdominal and transvaginal ultrasound examinations were performed for complete evaluation of the gestation as well as the maternal uterus, adnexal regions, and pelvic cul-de-sac. Transvaginal technique was performed to assess early pregnancy. COMPARISON:  None. FINDINGS: Intrauterine gestational sac: Probable single early intrauterine gestational sac Yolk sac:  Not Visualized. Embryo:  Not Visualized. MSD: 7 mm   5 weeks Subchorionic hemorrhage:  None visualized. Maternal uterus/adnexae: Normal appearance of left ovary. Probable small right ovarian corpus luteum. No suspicious adnexal mass or abnormal free fluid. IMPRESSION: Probable single early approximately 5 week intrauterine gestational sac. Consider following serial b-hCG levels, with followup ultrasound to assess viability in 10-14 days. No suspicious adnexal mass or abnormal free fluid identified. Electronically Signed   By: Myles Rosenthal M.D.   On: 01/08/2018 16:16   MAU Course  Procedures  MDM Labs and [place] ordered and reviewed. No IUP or adnexal mass seen on [place], findings could indicate early pregnancy, ectopic pregnancy, or failed  pregnancy-discussed with pt. Will follow quant in 48 hrs. No Rhogam since <9 wks. Presentation, clinical findings, and plan discussed with Dr. Dareen Piano. Stable for discharge home.  Assessment and Plan   1. Pregnancy of unknown anatomic location   2. Vaginal bleeding in pregnancy    Discharge home Follow up in MAU on 4/14 for labs Ectopic/return precautions  Allergies as of 01/08/2018      Reactions   Promethazine Hcl Other (See Comments)   Pt states that this medication causes hallucinations.       Medication List    STOP taking these medications   azaTHIOprine 50 MG tablet Commonly known as:  IMURAN   Azelastine HCl 0.15 % Soln   fluocinonide cream 0.05 % Commonly known as:  LIDEX     TAKE these medications   acetaminophen 500 MG tablet Commonly known as:  TYLENOL Take 1,000 mg by mouth every 6 (six) hours as needed for mild pain.   calcium carbonate 500 MG chewable tablet Commonly known as:  TUMS - dosed in mg elemental calcium Chew 1 tablet by mouth daily as needed for indigestion or heartburn.   cholecalciferol 1000 units tablet Commonly known as:  VITAMIN D Take 2,000 Units by mouth daily.   folic acid 400 MCG tablet Commonly known as:  FOLVITE Take 3,200 mcg by mouth daily.   LamoTRIgine 200 MG Tb24 24 hour tablet TAKE 1 TABLET AT BEDTIME   montelukast 10 MG tablet Commonly known as:  SINGULAIR Take 10 mg by mouth every evening.   REMICADE IV Remicade   sertraline 50 MG tablet Commonly known as:  ZOLOFT Take 75 mg by mouth at bedtime.   SUMAtriptan  100 MG tablet Commonly known as:  IMITREX TK 1/2 TO 1 T PO PRF MIGRAINE. MAY REPEAT 1/2 TO 1 T IN 2 HOURS PRN. NOT TO EXCEED 2 TS IN 24 HOURS.      Donette Larry, CNM 01/08/2018, 3:30 PM

## 2018-01-10 ENCOUNTER — Inpatient Hospital Stay (HOSPITAL_COMMUNITY)
Admission: AD | Admit: 2018-01-10 | Discharge: 2018-01-10 | Disposition: A | Discharge status: Home / Self Care | Payer: 59 | Source: Ambulatory Visit | Attending: Obstetrics and Gynecology | Admitting: Obstetrics and Gynecology

## 2018-01-10 ENCOUNTER — Other Ambulatory Visit: Payer: Self-pay

## 2018-01-10 DIAGNOSIS — Z09 Encounter for follow-up examination after completed treatment for conditions other than malignant neoplasm: Secondary | ICD-10-CM | POA: Insufficient documentation

## 2018-01-10 DIAGNOSIS — O209 Hemorrhage in early pregnancy, unspecified: Secondary | ICD-10-CM | POA: Diagnosis not present

## 2018-01-10 DIAGNOSIS — Z3A01 Less than 8 weeks gestation of pregnancy: Secondary | ICD-10-CM | POA: Diagnosis not present

## 2018-01-10 LAB — HCG, QUANTITATIVE, PREGNANCY: HCG, BETA CHAIN, QUANT, S: 1097 m[IU]/mL — AB (ref <5)

## 2018-01-10 NOTE — MAU Provider Note (Signed)
[name]  is a 37 y.o. [id number]  at [redacted]w[redacted]d who presents to MAU today for follow-up quant hCG. The patient denies abdominal pain, vaginal bleeding, N/V or fever.   BP 120/85 (BP Location: Right Arm)   Pulse 91   Temp 98.1 F (36.7 C)   Resp 16   LMP 12/11/2017   SpO2 100%   GENERAL: Well-developed, well-nourished female in no acute distress.  HEENT: Normocephalic, atraumatic.   LUNGS: Effort normal HEART: Regular rate  SKIN: Warm, dry and without erythema PSYCH: Normal mood and affect   A: Appropriate rise in quant hCG after 48 hours Component     Latest Ref Rng & Units 01/08/2018 01/10/2018  HCG, Beta Chain, Quant, S     <5 mIU/mL 537 (H) 1,097 (H)    P: Discharge home Bleeding/Ectopic precautions discussed Pt has f/u scheduled in office with Dr. Henderson Cloud Patient may return to MAU as needed or if her condition were to change or worsen   Judeth Horn, NP  01/10/2018 12:45 PM

## 2018-01-10 NOTE — Discharge Instructions (Signed)

## 2018-01-10 NOTE — MAU Note (Signed)
No complaints.  No longer bleeding, no longer cramping.  Denies any issues.

## 2018-01-11 LAB — GC/CHLAMYDIA PROBE AMP (~~LOC~~) NOT AT ARMC
CHLAMYDIA, DNA PROBE: NEGATIVE
NEISSERIA GONORRHEA: NEGATIVE

## 2018-01-25 ENCOUNTER — Telehealth: Payer: Self-pay | Admitting: Neurology

## 2018-01-25 DIAGNOSIS — G40209 Localization-related (focal) (partial) symptomatic epilepsy and epileptic syndromes with complex partial seizures, not intractable, without status epilepticus: Secondary | ICD-10-CM

## 2018-01-25 DIAGNOSIS — G40309 Generalized idiopathic epilepsy and epileptic syndromes, not intractable, without status epilepticus: Secondary | ICD-10-CM

## 2018-01-25 NOTE — Addendum Note (Signed)
Addended by: Lenny Pastel on: 01/25/2018 01:13 PM   Modules accepted: Orders

## 2018-01-25 NOTE — Telephone Encounter (Signed)
Patient called and was told to call to see what to do regarding a Follow up and blood work. She is [redacted] weeks pregnant. Please Call. Thanks

## 2018-01-25 NOTE — Telephone Encounter (Signed)
Spoke with pt relaying message below.  Orders for labs placed for pt to have at her convenience.

## 2018-01-25 NOTE — Telephone Encounter (Signed)
Her follow up is July 5th...  Please advise about blood work.

## 2018-01-25 NOTE — Telephone Encounter (Signed)
Pls let her know congratulations! And thanks for updating [place]. Would do a Lamictal level, she can do it before her follow-up so we have the results by then. Thanks!

## 2018-02-07 ENCOUNTER — Other Ambulatory Visit: Payer: Self-pay | Admitting: Neurology

## 2018-02-18 LAB — OB RESULTS CONSOLE HEPATITIS B SURFACE ANTIGEN: Hepatitis B Surface Ag: NEGATIVE

## 2018-02-18 LAB — OB RESULTS CONSOLE ABO/RH: RH TYPE: NEGATIVE

## 2018-02-18 LAB — OB RESULTS CONSOLE RUBELLA ANTIBODY, IGM: Rubella: IMMUNE

## 2018-02-18 LAB — OB RESULTS CONSOLE HIV ANTIBODY (ROUTINE TESTING): HIV: NONREACTIVE

## 2018-02-18 LAB — OB RESULTS CONSOLE RPR: RPR: NONREACTIVE

## 2018-04-02 ENCOUNTER — Encounter: Payer: Self-pay | Admitting: Neurology

## 2018-04-02 ENCOUNTER — Other Ambulatory Visit: Payer: Self-pay

## 2018-04-02 ENCOUNTER — Ambulatory Visit (INDEPENDENT_AMBULATORY_CARE_PROVIDER_SITE_OTHER): Payer: 59 | Admitting: Neurology

## 2018-04-02 VITALS — BP 106/68 | HR 91 | Ht 59.0 in | Wt 150.0 lb

## 2018-04-02 DIAGNOSIS — G40309 Generalized idiopathic epilepsy and epileptic syndromes, not intractable, without status epilepticus: Secondary | ICD-10-CM | POA: Diagnosis not present

## 2018-04-02 DIAGNOSIS — G43829 Menstrual migraine, not intractable, without status migrainosus: Secondary | ICD-10-CM

## 2018-04-02 MED ORDER — LAMOTRIGINE ER 100 MG PO TB24: ORAL_TABLET | ORAL | 6 refills | Status: DC

## 2018-04-02 NOTE — Patient Instructions (Signed)
1. Increase Lamictal XR to 364m daily. Take 203mwith additional 10052mablet daily  2. Re-check lamictal level in a week, then monthly after  3. Follow-up in 6 months, call for any changes  Seizure Precautions: 1. If medication has been prescribed for you to prevent seizures, take it exactly as directed.  Do not stop taking the medicine without talking to your doctor first, even if you have not had a seizure in a long time.   2. Avoid activities in which a seizure would cause danger to yourself or to others.  Don't operate dangerous machinery, swim alone, or climb in high or dangerous places, such as on ladders, roofs, or girders.  Do not drive unless your doctor says you may.  3. If you have any warning that you may have a seizure, lay down in a safe place where you can't hurt yourself.    4.  No driving for 6 months from last seizure, as per [facility] Please refer to the following link on the [employer] for more information: http://www.epilepsyfoundation.org/answerplace/Social/driving/drivingu.cfm   5.  Maintain good sleep hygiene. Avoid alcohol  6.  Notify your neurology if you are planning pregnancy or if you become pregnant.  7.  Contact your doctor if you have any problems that may be related to the medicine you are taking.  8.  Call 911 and bring the patient back to the ED if:        A.  The seizure lasts longer than 5 minutes.       B.  The patient doesn't awaken shortly after the seizure  C.  The patient has new problems such as difficulty seeing, speaking or moving  D.  The patient was injured during the seizure  E.  The patient has a temperature over 102 F (39C)  F.  The patient vomited and now is having trouble breathing

## 2018-04-02 NOTE — Progress Notes (Signed)
NEUROLOGY FOLLOW UP OFFICE NOTE  Desiree Marshall [id number] 09-09-1981  HISTORY OF PRESENT ILLNESS: I had the pleasure of seeing Desiree Marshall in follow-up in the neurology clinic on 04/02/2018.  The patient was last seen 5 months ago for seizures and migraines. She denies any seizures since 2009. She is currently on Lamictal XR 200mg  daily without side effects. She is now [redacted] weeks pregnant, due in December, last Lamictal level done at [facility] office on 02/18/18 was 3.8. She denies any seizures or seizure-like symptoms, no speech difficulties, staring/unresponsive episodes, gaps in time, olfactory/gustatory hallucinations, myoclonic jerks. She has catemenial migraines and continues to have migraines a few days each month. Last migraine was a couple of weeks ago lasting 3 days. She takes prn Tylenol while pregnant. She reports the headaches associated with coughing and straining are much better, she has them only every now and then. She denies any focal numbness/tingling/weakness, dizziness, no falls.   HPI 11/03/2017: This is a pleasant 37 yo RH woman with a history of Crohn's disease, migraines, and seizures. She had been seeing neurologist Dr. Terrace Arabia, records were reviewed. She started having bad migraines in high school, usually with left frontal pressure. She used to have nausea/vomiting, none since having Imitrex available. No photo/phonophobia. They usually occur with her menstrual period, she usually gets 3-4 migraines a month during the time of her menses. No side effects on Imitrex. She is concerned about a different type of pain this past year, when she laughs, sneezes, or bears down, she would have a brief diffuse sharp pain in her head like she is dizzy, then goes away after a second. There is no associated confusion, focal numbness/tingling/weakness, nausea/vomiting, visual obscurations. Poor sleep is a trigger for her migraines. No family history of migraines. She denies any tinnitus, diplopia,  dysarthria/dysphagia, neck pain. She has occasional dizziness upon standing a couple of times a month lasting a few minutes. She usually gets 6-7 hours of sleep. No history of head injuries.   Seizures started in her teens, she would have speech difficulties where she thinks a word but can't say it. She would feel "foggy brain," then have a generalized convulsion. She has had only 3 seizures in her lifetime, the second occurred when she was tried to wean off Tegretol, and the last seizure occurred in 2009 when she missed a dose of Lamotrigine. She is taking Lamotrigine XR 200mg  daily without side effects.  She denies any olfactory/gustatory hallucinations, deja vu, rising epigastric sensation, focal numbness/tingling/weakness, myoclonic jerks.  Epilepsy Risk Factors:  She had a normal birth and early development.  There is no history of febrile convulsions, CNS infections such as meningitis/encephalitis, significant traumatic brain injury, neurosurgical procedures, or family history of seizures.  Diagnostic Data: EEG in 04/2016 was normal. 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 Lamictal level 11/2016: 5.0  PAST MEDICAL HISTORY: Past Medical History:  Diagnosis Date  . Anemia    Resolved  . Astigmatism   . Crohn's disease (HCC)   . Eczema   . Epilepsy (HCC)    Diagnosed at the age of 40.   . Kidney stone   . Migraine     MEDICATIONS: Current Outpatient Medications on File Prior to Visit  Medication Sig Dispense Refill  . acetaminophen (TYLENOL) 500 MG tablet Take 1,000 mg by mouth every 6 (  six) hours as needed for mild pain.     . calcium carbonate (TUMS - DOSED IN MG ELEMENTAL CALCIUM) 500 MG chewable tablet Chew 1 tablet by mouth daily as needed for indigestion or heartburn.    .  cholecalciferol (VITAMIN D) 1000 UNITS tablet Take 2,000 Units by mouth daily.     . folic acid (FOLVITE) 400 MCG tablet Take 3,200 mcg by mouth daily.     . InFLIXimab (REMICADE IV) Remicade    . LamoTRIgine 200 MG TB24 24 hour tablet TAKE 1 TABLET AT BEDTIME (MUST KEEP YEARLY FOLLOW UP TO RECEIVE ANY FUTURE REFILLS) 90 tablet 0  . montelukast (SINGULAIR) 10 MG tablet Take 10 mg by mouth every evening.     . sertraline (ZOLOFT) 50 MG tablet Take 75 mg by mouth at bedtime.     . SUMAtriptan (IMITREX) 100 MG tablet TK 1/2 TO 1 T PO PRF MIGRAINE. MAY REPEAT 1/2 TO 1 T IN 2 HOURS PRN. NOT TO EXCEED 2 TS IN 24 HOURS.  5   No current facility-administered medications on file prior to visit.     ALLERGIES: Allergies  Allergen Reactions  . Promethazine Hcl Other (See Comments)    Pt states that this medication causes hallucinations.     FAMILY HISTORY: Family History  Problem Relation Age of Onset  . Asthma Mother   . Hypertension Mother   . Allergic rhinitis Mother   . Cancer Father 108       Prostate Cancer  . Heart disease Maternal Grandmother   . Heart disease Maternal Grandfather   . Stroke Paternal Grandmother   . Heart disease Paternal Grandfather   . Allergic rhinitis Brother   . Angioedema Neg Hx   . Atopy Neg Hx   . Eczema Neg Hx   . Immunodeficiency Neg Hx   . Urticaria Neg Hx     SOCIAL HISTORY: Social History   Socioeconomic History  . Marital status: Married    Spouse name: Daron Offer   . Number of children: 1  . Years of education: Masters  . Highest education level: Not on file  Occupational History  . Occupation: HOMEMAKER   Social Needs  . Financial resource strain: Not on file  . Food insecurity:    Worry: Not on file    Inability: Not on file  . Transportation needs:    Medical: Not on file    Non-medical: Not on file  Tobacco Use  . Smoking status: Never Smoker  . Smokeless tobacco: Never Used  Substance and Sexual Activity  . Alcohol use: No  .  Drug use: No  . Sexual activity: Yes    Partners: Male    Birth control/protection: None  Lifestyle  . Physical activity:    Days per week: Not on file    Minutes per session: Not on file  . Stress: Not on file  Relationships  . Social connections:    Talks on phone: Not on file    Gets together: Not on file    Attends religious service: Not on file    Active member of club or organization: Not on file    Attends meetings of clubs or organizations: Not on file    Relationship status: Not on file  . Intimate partner violence:    Fear of current or ex partner: Not on file    Emotionally abused: Not on file    Physically abused: Not on file    Forced sexual activity: Not  on file  Other Topics Concern  . Not on file  Social History Narrative   Marital Status:  Married Estate agent)    Children:  2 Sons (Clifford and Worthington)    Pets: Dog (1)    Living Situation: Lives with husband and son   Occupation: Futures trader    Education: Manufacturing engineer in Nursing Administration       Tobacco Use/Exposure:  None    Alcohol Use:  Occasional   Drug Use:  None   Diet:  Regular   Exercise:  Walking - once weekly    Hobbies: Baking - Sewing    Right-handed.   2 cups caffeine daily.             REVIEW OF SYSTEMS: Constitutional: No fevers, chills, or sweats, no generalized fatigue, change in appetite Eyes: No visual changes, double vision, eye pain Ear, nose and throat: No hearing loss, ear pain, nasal congestion, sore throat Cardiovascular: No chest pain, palpitations Respiratory:  No shortness of breath at rest or with exertion, wheezes GastrointestinaI: No nausea, vomiting, diarrhea, abdominal pain, fecal incontinence Genitourinary:  No dysuria, urinary retention or frequency Musculoskeletal:  No neck pain, back pain Integumentary: No rash, pruritus, skin lesions Neurological: as above Psychiatric: No depression, insomnia, anxiety Endocrine: No palpitations, fatigue, diaphoresis, mood  swings, change in appetite, change in weight, increased thirst Hematologic/Lymphatic:  No anemia, purpura, petechiae. Allergic/Immunologic: no itchy/runny eyes, nasal congestion, recent allergic reactions, rashes  PHYSICAL EXAM: Vitals:   04/02/18 0828  BP: 106/68  Pulse: 91  SpO2: 98%   General: No acute distress Head:  Normocephalic/atraumatic Neck: supple, no paraspinal tenderness, full range of motion Heart:  Regular rate and rhythm Lungs:  Clear to auscultation bilaterally Back: No paraspinal tenderness Skin/Extremities: No rash, no edema Neurological Exam: alert and oriented to person, place, and time. No aphasia or dysarthria. Fund of knowledge is appropriate.  Recent and remote memory are intact.  Attention and concentration are normal.    Able to name objects and repeat phrases. Cranial nerves: Pupils equal, round, reactive to light. Extraocular movements intact with no nystagmus. Visual fields full. Facial sensation intact. No facial asymmetry. Tongue, uvula, palate midline.  Motor: Bulk and tone normal, muscle strength 5/5 throughout with no pronator drift.  Sensation to light touch intact.  No extinction to double simultaneous stimulation. Finger to nose testing intact.  Gait narrow-based and steady, able to tandem walk adequately.    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 She has had only 3 seizures in her lifetime, well-controlled on Lamotrigine XR 200mg  daily with no side effects, last seizure was in 2009. She is currently [redacted] weeks pregnant, last Lamictal level on 02/18/18 was 3.8 (baseline level 5.0), we discussed needing to increase Lamictal dose to keep Lamictal levels similar to pre-pregnancy. She will increase Lamictal XR to 300mg  daily and re-check level in a week, then monthly thereafter. Migraines are unchanged, she would only like to take Tylenol during pregnancy. She is aware of [place] driving  laws to stop driving after a seizure, until 6 months seizure-free. She will follow-up in 6 months and knows to call for any changes.   Thank you for allowing me to participate in her care.  Please do not hesitate to call for any questions or concerns.  The duration of this appointment visit was 26 minutes of face-to-face time with the patient.  Greater than 50% of this time  was spent in counseling, explanation of diagnosis, planning of further management, and coordination of care.   Patrcia Dolly, M.D.   CC: Dr. Duaine Dredge

## 2018-04-09 ENCOUNTER — Other Ambulatory Visit: Payer: Self-pay | Admitting: Neurology

## 2018-04-09 ENCOUNTER — Telehealth: Payer: Self-pay | Admitting: Neurology

## 2018-04-09 DIAGNOSIS — G40209 Localization-related (focal) (partial) symptomatic epilepsy and epileptic syndromes with complex partial seizures, not intractable, without status epilepticus: Secondary | ICD-10-CM

## 2018-04-09 DIAGNOSIS — R569 Unspecified convulsions: Secondary | ICD-10-CM

## 2018-04-09 DIAGNOSIS — G40309 Generalized idiopathic epilepsy and epileptic syndromes, not intractable, without status epilepticus: Secondary | ICD-10-CM

## 2018-04-09 NOTE — Telephone Encounter (Signed)
Patient states she had her labs drawn this morning 7.12.19 and wants to know if she can get an order put in for next month.

## 2018-04-09 NOTE — Telephone Encounter (Signed)
Lab orders placed for next month

## 2018-04-12 LAB — LAMOTRIGINE LEVEL: Lamotrigine Lvl: 5.2 ug/mL (ref 2.0–20.0)

## 2018-04-16 ENCOUNTER — Telehealth: Payer: Self-pay

## 2018-04-16 DIAGNOSIS — G40309 Generalized idiopathic epilepsy and epileptic syndromes, not intractable, without status epilepticus: Secondary | ICD-10-CM

## 2018-04-16 DIAGNOSIS — R569 Unspecified convulsions: Secondary | ICD-10-CM

## 2018-04-16 DIAGNOSIS — G40209 Localization-related (focal) (partial) symptomatic epilepsy and epileptic syndromes with complex partial seizures, not intractable, without status epilepticus: Secondary | ICD-10-CM

## 2018-04-16 DIAGNOSIS — Z3A18 18 weeks gestation of pregnancy: Secondary | ICD-10-CM

## 2018-04-16 NOTE — Telephone Encounter (Signed)
-----   Message from Van Clines, MD sent at 04/16/2018 12:03 PM EDT ----- Pls let Desiree Marshall know her Lamictal level is back to baseline, 5.2. Re-check Lamictal level next month. Thanks

## 2018-04-16 NOTE — Telephone Encounter (Signed)
Spoke with pt relaying message below.  She states that she would like to have Aug labs drawn from [facility].  Let her know that I would place order and send to her via [facility].

## 2018-04-22 ENCOUNTER — Other Ambulatory Visit: Payer: Self-pay

## 2018-04-22 MED ORDER — LAMOTRIGINE ER 100 MG PO TB24: ORAL_TABLET | ORAL | 3 refills | Status: DC

## 2018-05-12 ENCOUNTER — Telehealth: Payer: Self-pay | Admitting: Neurology

## 2018-05-12 ENCOUNTER — Other Ambulatory Visit: Payer: Self-pay | Admitting: Neurology

## 2018-05-12 NOTE — Telephone Encounter (Signed)
Patient had her levels checked and wants to make sure we send a order for her to have them checked next month. She states that it is the

## 2018-05-12 NOTE — Telephone Encounter (Signed)
Sorry my computer cut off int he middle of the last message . She states that is the lamotrigine level. She left a VM

## 2018-05-13 ENCOUNTER — Other Ambulatory Visit: Payer: Self-pay

## 2018-05-13 DIAGNOSIS — Z3A18 18 weeks gestation of pregnancy: Secondary | ICD-10-CM

## 2018-05-13 DIAGNOSIS — G40309 Generalized idiopathic epilepsy and epileptic syndromes, not intractable, without status epilepticus: Secondary | ICD-10-CM

## 2018-05-13 DIAGNOSIS — G40209 Localization-related (focal) (partial) symptomatic epilepsy and epileptic syndromes with complex partial seizures, not intractable, without status epilepticus: Secondary | ICD-10-CM

## 2018-05-13 DIAGNOSIS — R569 Unspecified convulsions: Secondary | ICD-10-CM

## 2018-05-13 NOTE — Telephone Encounter (Signed)
Order placed for lab draw next month

## 2018-05-14 LAB — LAMOTRIGINE LEVEL: LAMOTRIGINE LVL: 6.7 ug/mL (ref 2.0–20.0)

## 2018-05-18 ENCOUNTER — Telehealth: Payer: Self-pay

## 2018-05-18 DIAGNOSIS — R569 Unspecified convulsions: Secondary | ICD-10-CM

## 2018-05-18 DIAGNOSIS — Z3490 Encounter for supervision of normal pregnancy, unspecified, unspecified trimester: Secondary | ICD-10-CM

## 2018-05-18 NOTE — Telephone Encounter (Signed)
-----   Message from Van Clines, MD sent at 05/17/2018  9:58 AM EDT ----- Pls let Desiree Marshall know her Lamictal leve looks great, 6.7. Stay on same dose of lamictal, recheck level next month. Thanks

## 2018-05-18 NOTE — Telephone Encounter (Signed)
Lab orders placed via [facility] and sent to pt via [facility]

## 2018-05-18 NOTE — Telephone Encounter (Signed)
Spoke with pt relaying message below.  She requests that I send lab orders to her home.  Orders placed

## 2018-06-04 ENCOUNTER — Telehealth: Payer: Self-pay | Admitting: Neurology

## 2018-06-04 ENCOUNTER — Other Ambulatory Visit: Payer: Self-pay | Admitting: *Deleted

## 2018-06-04 MED ORDER — LAMOTRIGINE ER 200 MG PO TB24: ORAL_TABLET | ORAL | 1 refills | Status: DC

## 2018-06-04 NOTE — Telephone Encounter (Signed)
Rx sent 

## 2018-06-04 NOTE — Telephone Encounter (Signed)
Patient called and is needing to see if you can call in her Lamotrigine  medication 30 day to [facility] and 60 to [facility] day [facility]? Thanks

## 2018-06-17 ENCOUNTER — Telehealth: Payer: Self-pay

## 2018-06-17 NOTE — Telephone Encounter (Signed)
Spoke with pt relaying message below.  She states that she has enough lamictal on hand between 100mg  and 200mg  tablets to do increase.  Asked that if she needed more sent to pharmacy to give me a call.

## 2018-06-17 NOTE — Telephone Encounter (Signed)
-----   Message from Van Clines, MD sent at 06/16/2018  4:11 PM EDT ----- Regarding: Lamictal Pls let her know I received labs from OB, Lamictal level 06/11/18 was 3.1. We will need to increase dose again, Increase Lamictal XR 200mg : Take 2 tabs daily (400mg  daily dose). Thanks

## 2018-07-01 ENCOUNTER — Other Ambulatory Visit: Payer: Self-pay

## 2018-07-01 MED ORDER — LAMOTRIGINE ER 200 MG PO TB24: ORAL_TABLET | ORAL | 3 refills | Status: DC

## 2018-07-26 ENCOUNTER — Telehealth: Payer: Self-pay

## 2018-07-26 MED ORDER — LAMOTRIGINE ER 200 MG PO TB24: ORAL_TABLET | ORAL | 3 refills | Status: DC

## 2018-07-26 NOTE — Telephone Encounter (Signed)
-----   Message from Van Clines, MD sent at 07/26/2018 11:36 AM EDT ----- Regarding: Lamictal level Pls ask how she is doing. Pls let her know Lamictal level was 3.9, we would like it back to around 6. Please increase Lamictal XR 200mg : Take 3 tabs daily. Check lamictal level again next month, thanks!

## 2018-07-26 NOTE — Telephone Encounter (Signed)
Spoke with pt relaying message below.  She states that she has enough Lamictal on hand to last a few days with the increase.  Asked that I send Rx.  Verified preferred pharmacy.    Lamotrigine 200mg  TB24 #270 with 3 refills Sig = Take 3 tablets each night at bedtime  Sent to [facility]

## 2018-07-30 ENCOUNTER — Inpatient Hospital Stay (HOSPITAL_COMMUNITY)
Admission: AD | Admit: 2018-07-30 | Discharge: 2018-07-30 | Disposition: A | Discharge status: Home / Self Care | Payer: 59 | Source: Ambulatory Visit | Attending: Obstetrics | Admitting: Obstetrics

## 2018-07-30 ENCOUNTER — Other Ambulatory Visit: Payer: Self-pay

## 2018-07-30 ENCOUNTER — Inpatient Hospital Stay (HOSPITAL_BASED_OUTPATIENT_CLINIC_OR_DEPARTMENT_OTHER): Payer: 59

## 2018-07-30 ENCOUNTER — Encounter (HOSPITAL_COMMUNITY): Payer: Self-pay

## 2018-07-30 DIAGNOSIS — G40909 Epilepsy, unspecified, not intractable, without status epilepticus: Secondary | ICD-10-CM | POA: Diagnosis not present

## 2018-07-30 DIAGNOSIS — O26893 Other specified pregnancy related conditions, third trimester: Secondary | ICD-10-CM | POA: Insufficient documentation

## 2018-07-30 DIAGNOSIS — K5090 Crohn's disease, unspecified, without complications: Secondary | ICD-10-CM | POA: Insufficient documentation

## 2018-07-30 DIAGNOSIS — Z3A33 33 weeks gestation of pregnancy: Secondary | ICD-10-CM | POA: Diagnosis not present

## 2018-07-30 DIAGNOSIS — O09523 Supervision of elderly multigravida, third trimester: Secondary | ICD-10-CM | POA: Insufficient documentation

## 2018-07-30 DIAGNOSIS — Z87442 Personal history of urinary calculi: Secondary | ICD-10-CM | POA: Insufficient documentation

## 2018-07-30 DIAGNOSIS — Z79899 Other long term (current) drug therapy: Secondary | ICD-10-CM | POA: Diagnosis not present

## 2018-07-30 DIAGNOSIS — O99353 Diseases of the nervous system complicating pregnancy, third trimester: Secondary | ICD-10-CM | POA: Diagnosis not present

## 2018-07-30 DIAGNOSIS — O99613 Diseases of the digestive system complicating pregnancy, third trimester: Secondary | ICD-10-CM | POA: Diagnosis not present

## 2018-07-30 DIAGNOSIS — Z8249 Family history of ischemic heart disease and other diseases of the circulatory system: Secondary | ICD-10-CM | POA: Insufficient documentation

## 2018-07-30 DIAGNOSIS — L299 Pruritus, unspecified: Secondary | ICD-10-CM | POA: Diagnosis not present

## 2018-07-30 DIAGNOSIS — O368130 Decreased fetal movements, third trimester, not applicable or unspecified: Secondary | ICD-10-CM

## 2018-07-30 DIAGNOSIS — Z825 Family history of asthma and other chronic lower respiratory diseases: Secondary | ICD-10-CM | POA: Insufficient documentation

## 2018-07-30 DIAGNOSIS — Z888 Allergy status to other drugs, medicaments and biological substances status: Secondary | ICD-10-CM | POA: Insufficient documentation

## 2018-07-30 LAB — COMPREHENSIVE METABOLIC PANEL
ALT: 16 U/L (ref 0–44)
ANION GAP: 8 (ref 5–15)
AST: 21 U/L (ref 15–41)
Albumin: 2.7 g/dL — ABNORMAL LOW (ref 3.5–5.0)
Alkaline Phosphatase: 115 U/L (ref 38–126)
BUN: 5 mg/dL — ABNORMAL LOW (ref 6–20)
CHLORIDE: 104 mmol/L (ref 98–111)
CO2: 23 mmol/L (ref 22–32)
Calcium: 8.3 mg/dL — ABNORMAL LOW (ref 8.9–10.3)
Creatinine, Ser: 0.56 mg/dL (ref 0.44–1.00)
Glucose, Bld: 103 mg/dL — ABNORMAL HIGH (ref 70–99)
POTASSIUM: 3.9 mmol/L (ref 3.5–5.1)
SODIUM: 135 mmol/L (ref 135–145)
Total Bilirubin: 1 mg/dL (ref 0.3–1.2)
Total Protein: 6.4 g/dL — ABNORMAL LOW (ref 6.5–8.1)

## 2018-07-30 LAB — CBC
HCT: 36 % (ref 36.0–46.0)
Hemoglobin: 11.3 g/dL — ABNORMAL LOW (ref 12.0–15.0)
MCH: 29.1 pg (ref 26.0–34.0)
MCHC: 31.4 g/dL (ref 30.0–36.0)
MCV: 92.8 fL (ref 80.0–100.0)
PLATELETS: 254 10*3/uL (ref 150–400)
RBC: 3.88 MIL/uL (ref 3.87–5.11)
RDW: 16.9 % — AB (ref 11.5–15.5)
WBC: 6.9 10*3/uL (ref 4.0–10.5)
nRBC: 0 % (ref 0.0–0.2)

## 2018-07-30 MED ORDER — HYDROXYZINE HCL 25 MG PO TABS: 25.0000 mg | ORAL_TABLET | Freq: Four times a day (QID) | ORAL | 0 refills | Status: DC

## 2018-07-30 NOTE — Discharge Instructions (Signed)
Cholestasis of Pregnancy Cholestasis refers to any condition that causes the flow of digestive fluid (bile) produced by the liver to slow down or stop. Cholestasis of pregnancy is most common toward the end of pregnancy (thirdtrimester), but it can occur any time during pregnancy. The condition often goes away soon after giving birth. Cholestasis may be uncomfortable, but it is usually harmless to you. However, it can be harmful to your baby. Cholestasis may increase the risk of:  Your baby being born too early (preterm delivery).  Your baby having a slow heart rate and lack of oxygen during delivery (fetal distress).  Losing your baby before delivery (stillbirth).  What are the causes? The exact cause of this condition is not known, but it may be related to:  Pregnancy hormones. The gallbladder normally holds bile until you need it to help digest fat in your diet. Pregnancy hormones may cause the flow of bile to slow down and back up into your liver. Bile may then get into your bloodstream and cause cholestasis symptoms.  Changes in your genes (genetic mutations). Specifically, genes that affect how the liver releases bile.  What increases the risk? You are more likely to develop this condition if:  You had cholestasis during a previous pregnancy.  You have a family history of cholestasis.  You have liver problems.  You are having multiple babies, such as twins or triplets.  What are the signs or symptoms? The most common symptom of this condition is intense itching (pruritus), especially on the palms of your hands and soles of your feet. The itching can spread to the rest of your body and is often worse at night. You will not usually have a rash. Other symptoms may include:  Feeling tired.  Pain in your upper right abdomen.  Dark-colored urine.  Light-colored stools.  Poor appetite.  Yellowish discoloration of your skin and the whites of your eyes (jaundice).  How is this  diagnosed? This condition is diagnosed based on:  Your medical history.  A physical exam.  Blood tests.  If you have an inherited risk for developing this condition, you may also have genetic testing. How is this treated? The goal of treatment is to make you comfortable and keep your baby safe. Your health care provider may:  Prescribe medicine to reduce bile acid in your bloodstream, relieve symptoms, and help keep your baby safe.  Give you vitamin K before delivery to prevent excessive bleeding.  Check your baby frequently (fetal monitoring).  Perform regular blood tests to check your bile levels and liver function until your baby is delivered.  Recommend starting (inducing) your labor and delivery by week 36 or 37 of pregnancy, or as soon as your baby's lungs have developed enough.  Follow these instructions at home:  Take over-the-counter and prescription medicines only as told by your health care provider.  Take cool baths to soothe itchy skin.  Wear comfortable, loose-fitting, cotton clothing to reduce itching.  Keep your fingernails short to prevent skin irritation from scratching.  Keep all follow-up visits and prenatal visits as told by your health care provider. This is important. Contact a health care provider if:  Your symptoms get worse, even with treatment.  You develop pain in your right side.  You have unusual swelling in your abdomen, feet, ankles, or legs.  You have a fever.  You are more thirsty than usual. Get help right away if:  You go into early labor.  You have a headache that   does not go away or causes changes in vision.  You have nausea or you vomit.  You have severe pain in your abdomen or shoulders.  You have shortness of breath. Summary  Cholestasis of pregnancy is most common toward the end of pregnancy (thirdtrimester), but it can occur any time during your pregnancy.  The condition often goes away soon after your baby is  born.  The most common symptom of cholestasis of pregnancy is intense itching (pruritus), especially on the palms of your hands and soles of your feet.  This condition may be treated with medicine, frequent monitoring, or starting (inducing) labor and delivery by week 36 or 37 of pregnancy. This information is not intended to replace advice given to you by your health care provider. Make sure you discuss any questions you have with your health care provider. Document Released: 09/12/2000 Document Revised: 08/30/2016 Document Reviewed: 08/30/2016 Elsevier Interactive Patient Education  2017 [employer].  Safe Medications in Pregnancy   Acne: Benzoyl Peroxide Salicylic Acid  Backache/Headache: Tylenol: 2 regular strength every 4 hours OR              2 Extra strength every 6 hours  Colds/Coughs/Allergies: Benadryl (alcohol free) 25 mg every 6 hours as needed Breath right strips Claritin Cepacol throat lozenges Chloraseptic throat spray Cold-Eeze- up to three times per day Cough drops, alcohol free Flonase (by prescription only) Guaifenesin Mucinex Robitussin DM (plain only, alcohol free) Saline nasal spray/drops Sudafed (pseudoephedrine) & Actifed ** use only after [redacted] weeks gestation and if you do not have high blood pressure Tylenol Vicks Vaporub Zinc lozenges Zyrtec   Constipation: Colace Ducolax suppositories Fleet enema Glycerin suppositories Metamucil Milk of magnesia Miralax Senokot Smooth move tea  Diarrhea: Kaopectate Imodium A-D  *NO pepto Bismol  Hemorrhoids: Anusol Anusol HC Preparation H Tucks  Indigestion: Tums Maalox Mylanta Zantac  Pepcid  Insomnia: Benadryl (alcohol free) 25mg  every 6 hours as needed Tylenol PM Unisom, no Gelcaps  Leg Cramps: Tums MagGel  Nausea/Vomiting:  Bonine Dramamine Emetrol Ginger extract Sea bands Meclizine  Nausea medication to take during pregnancy:  Unisom (doxylamine succinate 25 mg  tablets) Take one tablet daily at bedtime. If symptoms are not adequately controlled, the dose can be increased to a maximum recommended dose of two tablets daily (1/2 tablet in the morning, 1/2 tablet mid-afternoon and one at bedtime). Vitamin B6 100mg  tablets. Take one tablet twice a day (up to 200 mg per day).  Skin Rashes: Aveeno products Benadryl cream or 25mg  every 6 hours as needed Calamine Lotion 1% cortisone cream  Yeast infection: Gyne-lotrimin 7 Monistat 7   **If taking multiple medications, please check labels to avoid duplicating the same active ingredients **take medication as directed on the label ** Do not exceed 4000 mg of tylenol in 24 hours **Do not take medications that contain aspirin or ibuprofen

## 2018-07-30 NOTE — MAU Note (Signed)
Pt could not get urine sample.

## 2018-07-30 NOTE — MAU Note (Signed)
Pt became very tearful in triage.  Had just received call that her father is in the ER.  (pt sent on to [facility] and rm- will finish vs there)

## 2018-07-30 NOTE — MAU Provider Note (Signed)
History     CSN: [id number]  Arrival date and time: 07/30/18 6301   First Provider Initiated Contact with Patient 07/30/18 0957     Chief Complaint  Patient presents with  . Decreased Fetal Movement  . Pruritis   HPI Desiree Marshall is a 37 y.o. [id number] at 31w0dwho presents with itching and decreased fetal movement. She states she has not felt the baby move since yesterday afternoon. She also reports worsening itching all over her body throughout the week. She reports a history of cholestasis with her first pregnancy and feels like she has it again. She denies any pain. Denies leaking or vaginal bleeding.   OB History    Gravida  3   Para  2   Term  1   Preterm  1   AB  0   Living  2     SAB  0   TAB  0   Ectopic  0   Multiple  0   Live Births  2           Past Medical History:  Diagnosis Date  . Anemia    Resolved  . Astigmatism   . Crohn's disease ([facility]   . Eczema   . Epilepsy ([facility]    Diagnosed at the age of 89   . Kidney stone   . Migraine     Past Surgical History:  Procedure Laterality Date  . CESAREAN SECTION N/A 05/09/2014   Procedure: CESAREAN SECTION;  Surgeon: MDaria Pastures MD;  [facility];  Service: Obstetrics;  Laterality: N/A;  . CYSTOSCOPY WITH RETROGRADE PYELOGRAM, URETEROSCOPY AND STENT PLACEMENT Left 07/24/2016   Procedure: CYSTOSCOPY WITH RETROGRADE PYELOGRAM, URETEROSCOPY AND STENT PLACEMENT and laser lithotripsy;  Surgeon: MFestus Aloe MD;  Location: [facility];  Service: Urology;  Laterality: Left;  . HOLMIUM LASER APPLICATION Left 135/57/3220  Procedure: HOLMIUM LASER APPLICATION;  Surgeon: MFestus Aloe MD;  Location: [facility];  Service: Urology;  Laterality: Left;    Family History  Problem Relation Age of Onset  . Asthma Mother   . Hypertension Mother   . Allergic rhinitis Mother   . Cancer Father 578      Prostate Cancer  . Heart disease Maternal Grandmother   . Heart disease Maternal Grandfather   .  Stroke Paternal Grandmother   . Heart disease Paternal Grandfather   . Allergic rhinitis Brother   . Angioedema Neg Hx   . Atopy Neg Hx   . Eczema Neg Hx   . Immunodeficiency Neg Hx   . Urticaria Neg Hx     Social History   Tobacco Use  . Smoking status: Never Smoker  . Smokeless tobacco: Never Used  Substance Use Topics  . Alcohol use: No  . Drug use: No    Allergies:  Allergies  Allergen Reactions  . Promethazine Hcl Other (See Comments)    Pt states that this medication causes hallucinations.     Medications Prior to Admission  Medication Sig Dispense Refill Last Dose  . acetaminophen (TYLENOL) 500 MG tablet Take 1,000 mg by mouth every 6 (six) hours as needed for mild pain.    Taking  . azaTHIOprine (IMURAN) 50 MG tablet    Taking  . calcium carbonate (TUMS - DOSED IN MG ELEMENTAL CALCIUM) 500 MG chewable tablet Chew 1 tablet by mouth daily as needed for indigestion or heartburn.   Taking  . cholecalciferol (VITAMIN D) 1000 UNITS tablet Take 2,000 Units by  mouth daily.    Taking  . folic acid (FOLVITE) 592 MCG tablet Take 3,200 mcg by mouth daily.    Taking  . InFLIXimab (REMICADE IV) Remicade   Taking  . LamoTRIgine 100 MG TB24 24 hour tablet Take 1 tablet daily with 269m tablet for total dose of 3071mdaily 90 tablet 3   . LamoTRIgine 200 MG TB24 24 hour tablet TAKE 3 TABLETS AT BEDTIME 270 tablet 3   . montelukast (SINGULAIR) 10 MG tablet Take 10 mg by mouth every evening.    Taking  . sertraline (ZOLOFT) 50 MG tablet Take 75 mg by mouth at bedtime.    Taking  . SUMAtriptan (IMITREX) 100 MG tablet TK 1/2 TO 1 T PO PRF MIGRAINE. MAY REPEAT 1/2 TO 1 T IN 2 HOURS PRN. NOT TO EXCEED 2 TS IN 24 HOURS.  5 Taking    Review of Systems  Constitutional: Negative.  Negative for fatigue and fever.  HENT: Negative.   Respiratory: Negative.  Negative for shortness of breath.   Cardiovascular: Negative.  Negative for chest pain.  Gastrointestinal: Negative.  Negative for  abdominal pain, constipation, diarrhea, nausea and vomiting.  Genitourinary: Negative.  Negative for dysuria.  Skin:       pruitis  Neurological: Negative.  Negative for dizziness and headaches.   Physical Exam   Blood pressure 126/72, pulse (!) 103, temperature 98.1 F (36.7 C), temperature source Oral, resp. rate 18, last menstrual period 12/11/2017, unknown if currently breastfeeding.  Physical Exam  Nursing note and vitals reviewed. Constitutional: She is oriented to person, place, and time. She appears well-developed and well-nourished. No distress.  HENT:  Head: Normocephalic.  Eyes: Pupils are equal, round, and reactive to light.  Cardiovascular: Normal rate, regular rhythm and normal heart sounds.  Respiratory: Effort normal and breath sounds normal. No respiratory distress.  GI: Soft. Bowel sounds are normal. She exhibits no distension. There is no tenderness.  Neurological: She is alert and oriented to person, place, and time.  Skin: Skin is warm and dry.  Excoriations from scratching noted.  Psychiatric: She has a normal mood and affect. Her behavior is normal. Judgment and thought content normal.    MAU Course  Procedures Results for orders placed or performed during the hospital encounter of 07/30/18 (from the past 24 hour(s))  CBC     Status: Abnormal   Collection Time: 07/30/18 10:06 AM  Result Value Ref Range   WBC 6.9 4.0 - 10.5 K/uL   RBC 3.88 3.87 - 5.11 MIL/uL   Hemoglobin 11.3 (L) 12.0 - 15.0 g/dL   HCT 36.0 36.0 - 46.0 %   MCV 92.8 80.0 - 100.0 fL   MCH 29.1 26.0 - 34.0 pg   MCHC 31.4 30.0 - 36.0 g/dL   RDW 16.9 (H) 11.5 - 15.5 %   Platelets 254 150 - 400 K/uL   nRBC 0.0 0.0 - 0.2 %  Comprehensive metabolic panel     Status: Abnormal   Collection Time: 07/30/18 10:06 AM  Result Value Ref Range   Sodium 135 135 - 145 mmol/L   Potassium 3.9 3.5 - 5.1 mmol/L   Chloride 104 98 - 111 mmol/L   CO2 23 22 - 32 mmol/L   Glucose, Bld 103 (H) 70 - 99 mg/dL    BUN 5 (L) 6 - 20 mg/dL   Creatinine, Ser 0.56 0.44 - 1.00 mg/dL   Calcium 8.3 (L) 8.9 - 10.3 mg/dL   Total Protein 6.4 (L) 6.5 -  8.1 g/dL   Albumin 2.7 (L) 3.5 - 5.0 g/dL   AST 21 15 - 41 U/L   ALT 16 0 - 44 U/L   Alkaline Phosphatase 115 38 - 126 U/L   Total Bilirubin 1.0 0.3 - 1.2 mg/dL   GFR calc non Af Amer >60 >60 mL/min   GFR calc Af Amer >60 >60 mL/min   Anion gap 8 5 - 15   MDM Prenatal records from private office reviewed. Pregnancy complicated by epilepsy, depression and previous cesarean for non reassuring fetal heart tones. Labs ordered and reviewed.  UA CBC, CMP Bile acids NST BPP- 8/8 with AFI 20  Consulted with Dr. Roselie Awkward regarding NST and BPP results as well as itching- will give Vistaril for itching, tracing reviewed and ok to discharge home to follow up in the office Monday for management of bile salts results.   Assessment and Plan   1. Decreased fetal movements in third trimester, single or unspecified fetus   2. Itching   3. [redacted] weeks gestation of pregnancy    -Discharge home in stable condition -Rx for vistaril sent to patient's pharmacy -Fetal kick count precautions discussed -Patient advised to follow-up with [facility] on Monday for bile salt results, office called. -Patient may return to MAU as needed or if her condition were to change or worsen  Wende Mott CNM 07/30/2018, 9:57 AM

## 2018-07-30 NOTE — MAU Note (Signed)
Called dr because she has had increased itching all over, hx of cholestasis with last preg.  Noted a decrease in FM yesterday, moving today- just different and less.  Had a lot of pain on lower rt side last night, has subsided.

## 2018-07-30 NOTE — Progress Notes (Signed)
Pt was distressed when she arrived because she had just received a call that her father was in the Emergency Department.  She was anxious about her baby, who was having decreased movement at 33 weeks and worried about her father as well.  I offered prayer and ministry of listening.  Chaplain Dyanne Carrel, Bcc Pager, [phone] 3:29 PM   07/30/18 1500  Clinical Encounter Type  Visited With Patient  Visit Type Spiritual support  Referral From Nurse  Spiritual Encounters  Spiritual Needs Prayer

## 2018-08-03 LAB — BILE ACIDS, TOTAL: Bile Acids Total: 92.2 umol/L — ABNORMAL HIGH (ref 0.0–10.0)

## 2018-08-05 IMAGING — MR MR HEAD WO/W CM
11 of 12 series · 46 of 48 positions shown · IV contrast (7ml multihance)
Comparison: CT HEAD May 02, 2016

CLINICAL DATA: Severe headaches for 4 weeks. History of migraines
and epilepsy.

EXAM:
MRI HEAD WITHOUT AND WITH CONTRAST
TECHNIQUE: Multiplanar, multiecho pulse sequences of the brain and surrounding
structures were obtained without and with intravenous contrast.
CONTRAST:  12mL MULTIHANCE GADOBENATE DIMEGLUMINE 529 MG/ML IV SOLN

[Series 2: T1 · sagittal · 5.0mm · 0.45mm/px · 1 of 21 slices shown]
[im 1/21]
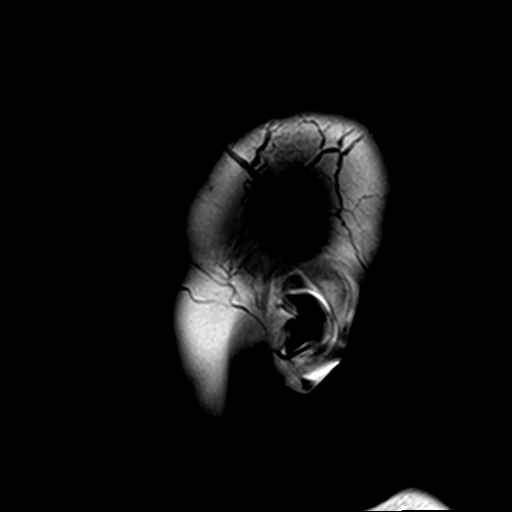

[Series 3: DWI · axial · 3.0mm · 1.80mm/px · z∈[-16,+129]mm · 8 of 100 slices shown (1 of 4)]
[im 1/100]
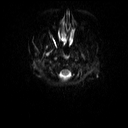
[im 15/100]
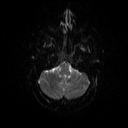
[im 29/100]
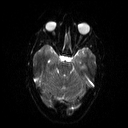
[im 43/100]
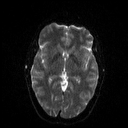
[im 57/100]
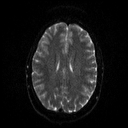
[im 71/100]
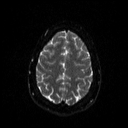
[im 85/100]
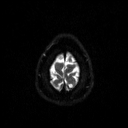
[im 100/100]
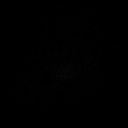

[Series 4: DWI · axial · 3.0mm · 1.80mm/px · z∈[-16,+129]mm · 4 of 50 slices shown (2 of 4)]
[im 1/50]
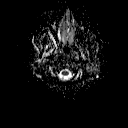
[im 17/50]
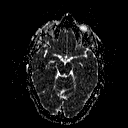
[im 33/50]
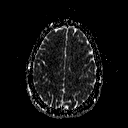
[im 50/50]
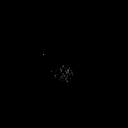

[Series 5: T2 · axial · 5.0mm · 0.51mm/px · z∈[-46,+100]mm · 2 of 24 slices shown (1 of 2)]
[im 1/24]
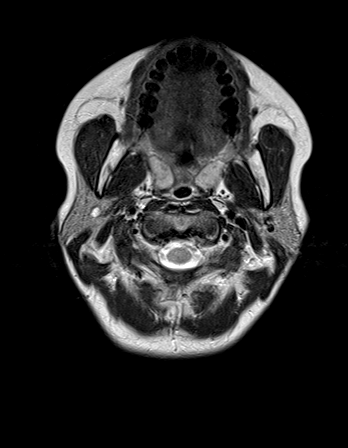
[im 24/24]
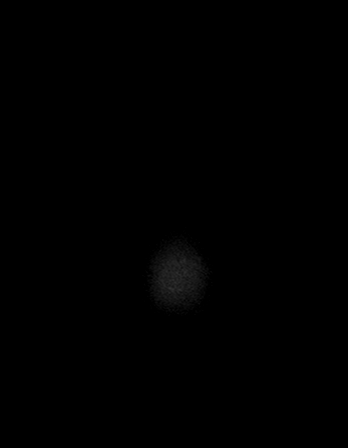

[Series 6: DWI · coronal · 5.0mm · 1.80mm/px · 6 of 76 slices shown (3 of 4)]
[im 1/76]
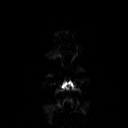
[im 16/76]
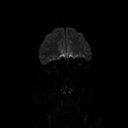
[im 31/76]
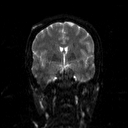
[im 46/76]
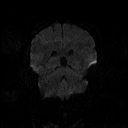
[im 61/76]
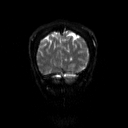
[im 76/76]
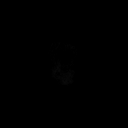

[Series 7: DWI · coronal · 5.0mm · 1.80mm/px · 3 of 38 slices shown (4 of 4)]
[im 1/38]
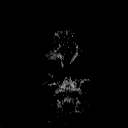
[im 19/38]
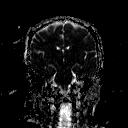
[im 38/38]
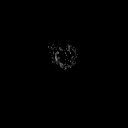

[Series 8: FLAIR · axial · 5.0mm · 0.45mm/px · z∈[-47,+99]mm · 2 of 24 slices shown]
[im 1/24]
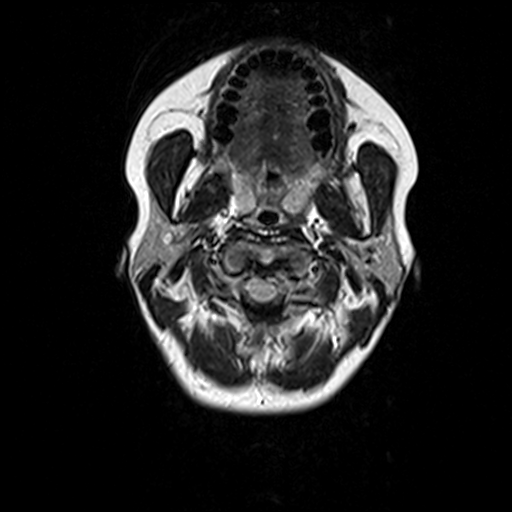
[im 24/24]
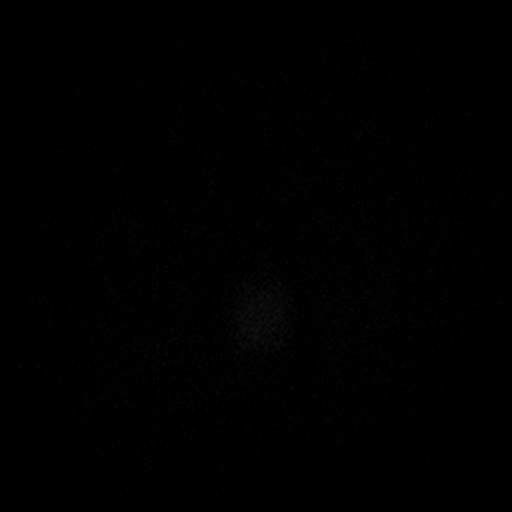

[Series 10: swi_images · axial · 2.0mm · 0.90mm/px · z∈[-48,+101]mm · 6 of 80 slices shown]
[im 1/80]
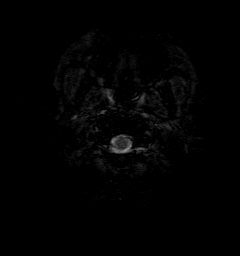
[im 16/80]
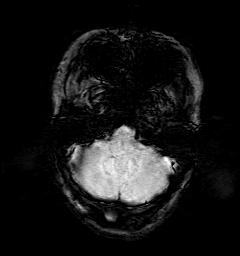
[im 32/80]
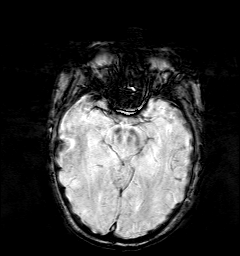
[im 48/80]
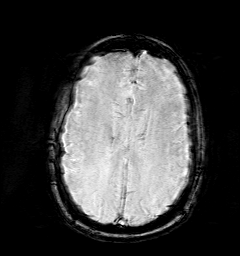
[im 64/80]
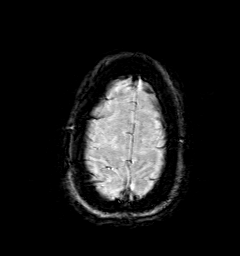
[im 80/80]
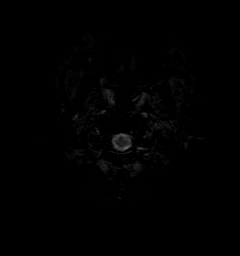

[Series 11: t1_mpr_tra · axial · 2.0mm · 0.45mm/px · z∈[-47,+102]mm · 6 of 80 slices shown (1 of 2)]
[im 1/80]
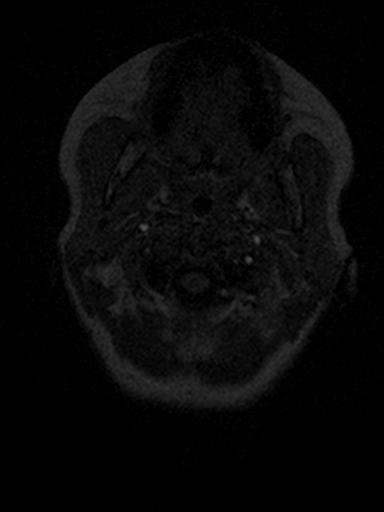
[im 16/80]
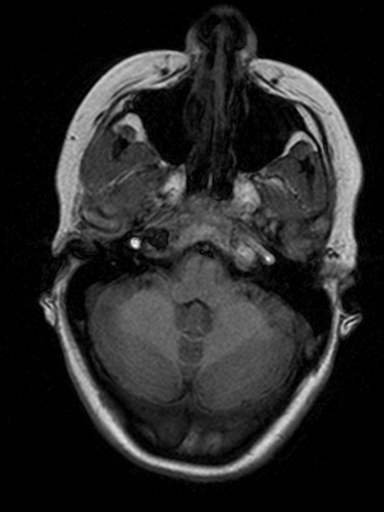
[im 32/80]
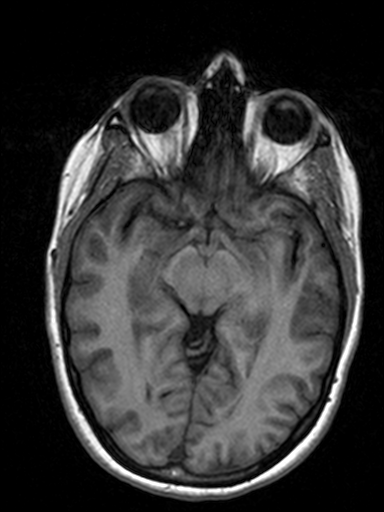
[im 48/80]
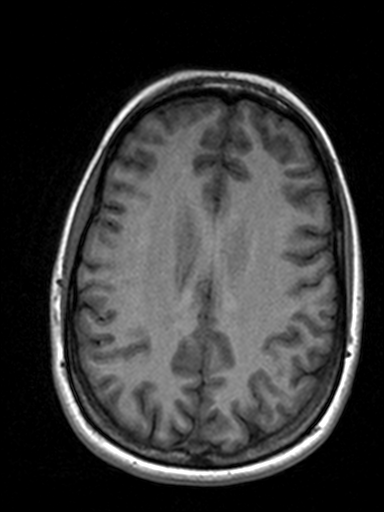
[im 64/80]
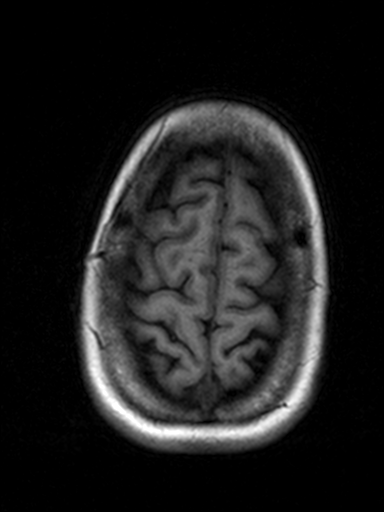
[im 80/80]
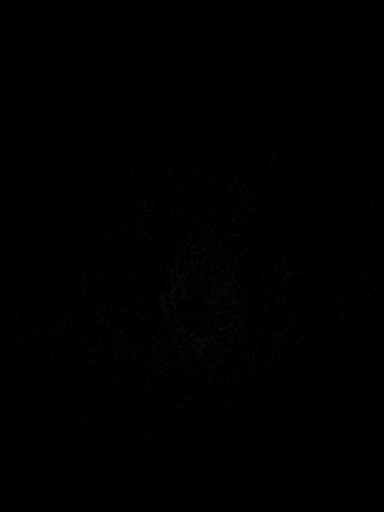

[Series 12: T2 · coronal · 5.0mm · 0.45mm/px · 2 of 30 slices shown (2 of 2)]
[im 1/30]
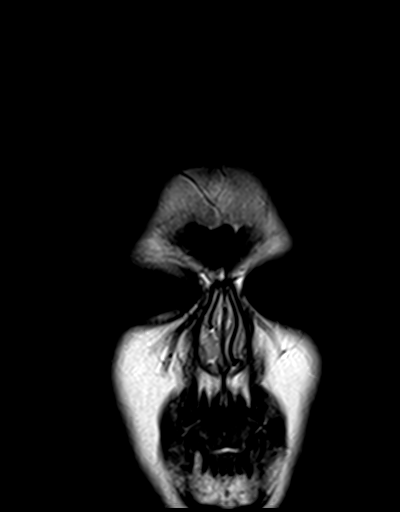
[im 30/30]
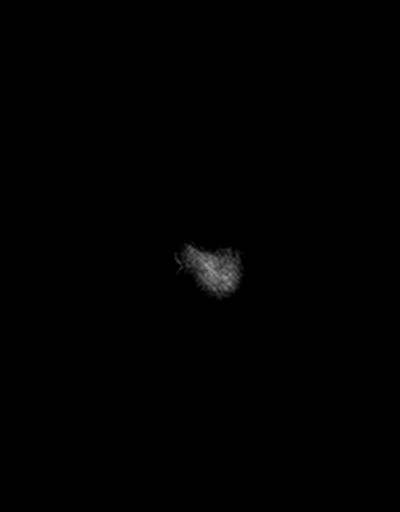

[Series 13: t1_mpr_tra · axial · 2.0mm · 0.45mm/px · z∈[-47,+102]mm · 6 of 80 slices shown (2 of 2)]
[im 1/80]
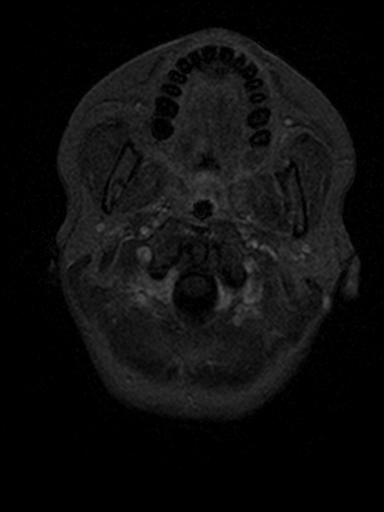
[im 16/80]
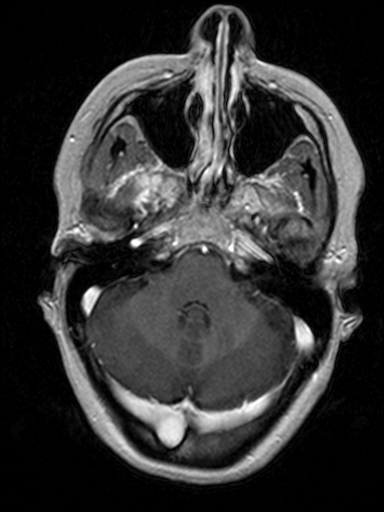
[im 32/80]
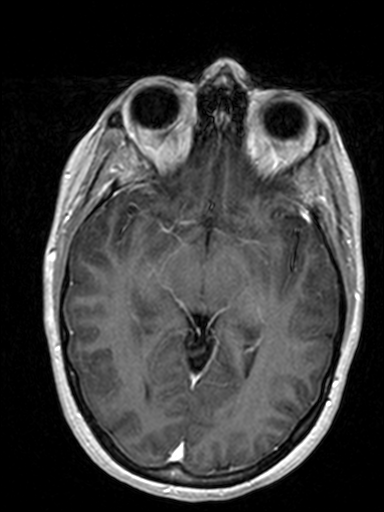
[im 48/80]
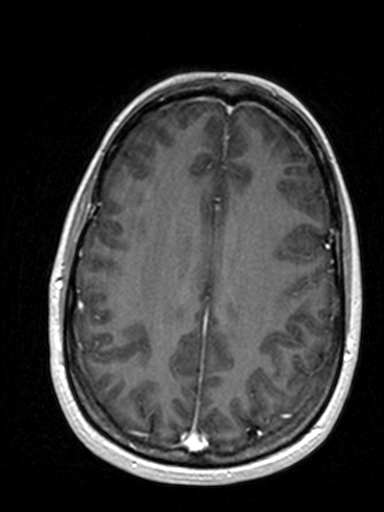
[im 64/80]
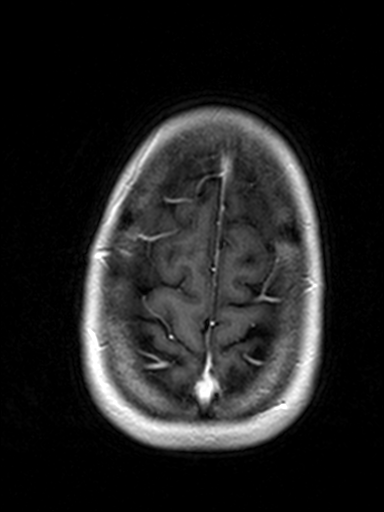
[im 80/80]
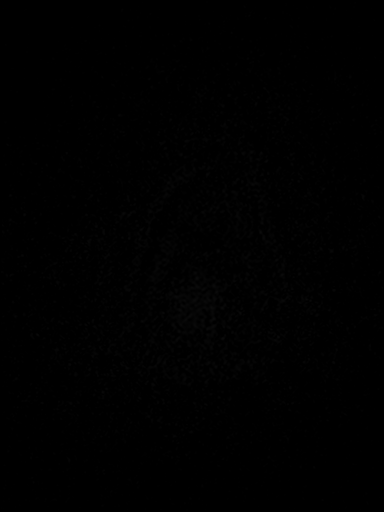

[46 of 48 positions shown; findings below may reference images not displayed]

FINDINGS: INTRACRANIAL CONTENTS: No reduced diffusion to suggest acute
ischemia. No susceptibility artifact to suggest hemorrhage. The
ventricles and sulci are normal for patient's age. No suspicious
parenchymal signal, masses or mass effect. No abnormal extra-axial
fluid collections. No extra-axial masses though, contrast enhanced
sequences would be more sensitive. Normal major intracranial
vascular flow voids present at skull base.

ORBITS: The included ocular globes and orbital contents are
non-suspicious.

SINUSES: Trace LEFT mastoid effusion. Minimal ethmoid and maxillary
sinus mucosal thickening .

SKULL/SOFT TISSUES: No abnormal sellar expansion. No suspicious
calvarial bone marrow signal. Craniocervical junction maintained.
Cerebellar tonsils descend 5 mm below the foramen magnum, with
mildly effaced cerebral spinal fluid space though, the tonsils are
not pointed in appearance. Mild fullness of the palatine tonsils and
adenoidal soft tissue suggest recent viral illness.
IMPRESSION: Borderline findings for Chiari 1 malformation.

Otherwise negative MRI of the brain with and without contrast.

Minimal paranasal sinusitis.

## 2018-08-06 ENCOUNTER — Observation Stay (HOSPITAL_COMMUNITY)
Admission: EM | Admit: 2018-08-06 | Discharge: 2018-08-07 | Disposition: A | Discharge status: Home / Self Care | Payer: 59 | Attending: Obstetrics and Gynecology | Admitting: Obstetrics and Gynecology

## 2018-08-06 ENCOUNTER — Emergency Department (HOSPITAL_COMMUNITY): Payer: 59

## 2018-08-06 ENCOUNTER — Other Ambulatory Visit: Payer: Self-pay

## 2018-08-06 ENCOUNTER — Encounter (HOSPITAL_COMMUNITY): Payer: Self-pay | Admitting: Emergency Medicine

## 2018-08-06 DIAGNOSIS — O9A213 Injury, poisoning and certain other consequences of external causes complicating pregnancy, third trimester: Secondary | ICD-10-CM | POA: Diagnosis not present

## 2018-08-06 DIAGNOSIS — Z79899 Other long term (current) drug therapy: Secondary | ICD-10-CM | POA: Diagnosis not present

## 2018-08-06 DIAGNOSIS — Y92410 Unspecified street and highway as the place of occurrence of the external cause: Secondary | ICD-10-CM | POA: Diagnosis not present

## 2018-08-06 DIAGNOSIS — Y9389 Activity, other specified: Secondary | ICD-10-CM | POA: Insufficient documentation

## 2018-08-06 DIAGNOSIS — O9989 Other specified diseases and conditions complicating pregnancy, childbirth and the puerperium: Principal | ICD-10-CM | POA: Insufficient documentation

## 2018-08-06 DIAGNOSIS — Z3A34 34 weeks gestation of pregnancy: Secondary | ICD-10-CM | POA: Diagnosis not present

## 2018-08-06 DIAGNOSIS — R1084 Generalized abdominal pain: Secondary | ICD-10-CM | POA: Diagnosis not present

## 2018-08-06 DIAGNOSIS — O479 False labor, unspecified: Secondary | ICD-10-CM

## 2018-08-06 DIAGNOSIS — Y999 Unspecified external cause status: Secondary | ICD-10-CM | POA: Insufficient documentation

## 2018-08-06 HISTORY — DX: Chronic kidney disease, unspecified: N18.9

## 2018-08-06 HISTORY — DX: Unspecified convulsions: R56.9

## 2018-08-06 HISTORY — DX: Calculus of gallbladder without cholecystitis without obstruction: K80.20

## 2018-08-06 LAB — I-STAT CHEM 8, ED
CHLORIDE: 103 mmol/L (ref 98–111)
CREATININE: 0.6 mg/dL (ref 0.44–1.00)
Calcium, Ion: 1.1 mmol/L — ABNORMAL LOW (ref 1.15–1.40)
Glucose, Bld: 97 mg/dL (ref 70–99)
HEMATOCRIT: 36 % (ref 36.0–46.0)
HEMOGLOBIN: 12.2 g/dL (ref 12.0–15.0)
POTASSIUM: 3.8 mmol/L (ref 3.5–5.1)
Sodium: 135 mmol/L (ref 135–145)
TCO2: 24 mmol/L (ref 22–32)

## 2018-08-06 LAB — CBC
HEMATOCRIT: 38.2 % (ref 36.0–46.0)
Hemoglobin: 11.5 g/dL — ABNORMAL LOW (ref 12.0–15.0)
MCH: 28.2 pg (ref 26.0–34.0)
MCHC: 30.1 g/dL (ref 30.0–36.0)
MCV: 93.6 fL (ref 80.0–100.0)
Platelets: 246 10*3/uL (ref 150–400)
RBC: 4.08 MIL/uL (ref 3.87–5.11)
RDW: 18.1 % — ABNORMAL HIGH (ref 11.5–15.5)
WBC: 7.1 10*3/uL (ref 4.0–10.5)
nRBC: 0.4 % — ABNORMAL HIGH (ref 0.0–0.2)

## 2018-08-06 LAB — PROTIME-INR
INR: 0.92
Prothrombin Time: 12.3 seconds (ref 11.4–15.2)

## 2018-08-06 LAB — COMPREHENSIVE METABOLIC PANEL
ALT: 13 U/L (ref 0–44)
AST: 21 U/L (ref 15–41)
Albumin: 2.6 g/dL — ABNORMAL LOW (ref 3.5–5.0)
Alkaline Phosphatase: 128 U/L — ABNORMAL HIGH (ref 38–126)
Anion gap: 8 (ref 5–15)
BILIRUBIN TOTAL: 0.7 mg/dL (ref 0.3–1.2)
BUN: 5 mg/dL — ABNORMAL LOW (ref 6–20)
CHLORIDE: 104 mmol/L (ref 98–111)
CO2: 23 mmol/L (ref 22–32)
Calcium: 8.7 mg/dL — ABNORMAL LOW (ref 8.9–10.3)
Creatinine, Ser: 0.7 mg/dL (ref 0.44–1.00)
GFR calc Af Amer: >60 mL/min (ref >60)
Glucose, Bld: 99 mg/dL (ref 70–99)
Potassium: 3.8 mmol/L (ref 3.5–5.1)
Sodium: 135 mmol/L (ref 135–145)
Total Protein: 6.1 g/dL — ABNORMAL LOW (ref 6.5–8.1)

## 2018-08-06 LAB — I-STAT CG4 LACTIC ACID, ED: LACTIC ACID, VENOUS: 1.34 mmol/L (ref 0.5–1.9)

## 2018-08-06 LAB — SAMPLE TO BLOOD BANK

## 2018-08-06 LAB — ETHANOL: Alcohol, Ethyl (B): <10 mg/dL (ref <10)

## 2018-08-06 MED ORDER — SODIUM CHLORIDE 0.9 % IV BOLUS
500.0000 mL | Freq: Once | INTRAVENOUS | Status: AC
  Administered 2018-08-06: 500 mL via INTRAVENOUS
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
## 2018-08-06 NOTE — ED Notes (Signed)
Husband arrived at bedside

## 2018-08-06 NOTE — ED Notes (Signed)
Rapid OB continued to be at bedside and speaking with patient and husband at bedside.
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
## 2018-08-06 NOTE — ED Notes (Signed)
Report given to [facility]  
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
## 2018-08-07 ENCOUNTER — Other Ambulatory Visit: Payer: Self-pay

## 2018-08-07 ENCOUNTER — Encounter (HOSPITAL_COMMUNITY): Payer: Self-pay | Admitting: *Deleted

## 2018-08-07 DIAGNOSIS — Z3A34 34 weeks gestation of pregnancy: Secondary | ICD-10-CM

## 2018-08-07 DIAGNOSIS — O9A213 Injury, poisoning and certain other consequences of external causes complicating pregnancy, third trimester: Secondary | ICD-10-CM

## 2018-08-07 LAB — CDS SEROLOGY

## 2018-08-07 MED ORDER — LAMOTRIGINE 200 MG PO TABS
600.0000 mg | ORAL_TABLET | Freq: Every day | ORAL | Status: DC
  Filled 2018-08-07: qty 3

## 2018-08-07 MED ORDER — URSODIOL 500 MG PO TABS
500.0000 mg | ORAL_TABLET | Freq: Two times a day (BID) | ORAL | Status: DC
  Administered 2018-08-07: 500 mg via ORAL
  Filled 2018-08-07: qty 1

## 2018-08-07 MED ORDER — DOCUSATE SODIUM 100 MG PO CAPS
100.0000 mg | ORAL_CAPSULE | Freq: Every day | ORAL | Status: DC
  Filled 2018-08-07: qty 1

## 2018-08-07 MED ORDER — SODIUM CHLORIDE 0.9 % IV SOLN: 250.0000 mL | INTRAVENOUS | Status: DC | PRN

## 2018-08-07 MED ORDER — CALCIUM CARBONATE ANTACID 500 MG PO CHEW: 2.0000 | CHEWABLE_TABLET | ORAL | Status: DC | PRN

## 2018-08-07 MED ORDER — FAMOTIDINE 20 MG PO TABS: 10.0000 mg | ORAL_TABLET | Freq: Every day | ORAL | Status: DC

## 2018-08-07 MED ORDER — SODIUM CHLORIDE 0.9% FLUSH: 3.0000 mL | INTRAVENOUS | Status: DC | PRN

## 2018-08-07 MED ORDER — AZATHIOPRINE 50 MG PO TABS
100.0000 mg | ORAL_TABLET | Freq: Every day | ORAL | Status: DC
  Administered 2018-08-07: 100 mg via ORAL
  Filled 2018-08-07: qty 2

## 2018-08-07 MED ORDER — LORATADINE 10 MG PO TABS
10.0000 mg | ORAL_TABLET | Freq: Every day | ORAL | Status: DC
  Filled 2018-08-07: qty 1

## 2018-08-07 MED ORDER — COMPLETENATE 29-1 MG PO CHEW
1.0000 | CHEWABLE_TABLET | Freq: Every day | ORAL | Status: DC
  Filled 2018-08-07: qty 1

## 2018-08-07 MED ORDER — MONTELUKAST SODIUM 10 MG PO TABS
10.0000 mg | ORAL_TABLET | Freq: Every day | ORAL | Status: DC
  Administered 2018-08-07: 10 mg via ORAL
  Filled 2018-08-07: qty 1

## 2018-08-07 MED ORDER — PRENATAL MULTIVITAMIN CH
1.0000 | ORAL_TABLET | Freq: Every day | ORAL | Status: DC
  Administered 2018-08-07: 1 via ORAL
  Filled 2018-08-07 (×2): qty 1

## 2018-08-07 MED ORDER — LAMOTRIGINE ER 200 MG PO TB24
600.0000 mg | ORAL_TABLET | Freq: Every day | ORAL | Status: DC
  Administered 2018-08-07: 600 mg via ORAL
  Filled 2018-08-07: qty 3

## 2018-08-07 MED ORDER — LAMOTRIGINE ER 200 MG PO TB24: 600.0000 mg | ORAL_TABLET | Freq: Every day | ORAL | Status: DC

## 2018-08-07 MED ORDER — URSODIOL 500 MG PO TABS
500.0000 mg | ORAL_TABLET | Freq: Two times a day (BID) | ORAL | Status: DC
  Filled 2018-08-07 (×2): qty 1

## 2018-08-07 MED ORDER — ZOLPIDEM TARTRATE 5 MG PO TABS: 5.0000 mg | ORAL_TABLET | Freq: Every evening | ORAL | Status: DC | PRN

## 2018-08-07 MED ORDER — ACETAMINOPHEN 325 MG PO TABS
650.0000 mg | ORAL_TABLET | ORAL | Status: DC | PRN
  Administered 2018-08-07 (×2): 650 mg via ORAL
  Filled 2018-08-07 (×2): qty 2

## 2018-08-07 MED ORDER — SODIUM CHLORIDE 0.9% FLUSH: 3.0000 mL | Freq: Two times a day (BID) | INTRAVENOUS | Status: DC

## 2018-08-07 NOTE — Progress Notes (Signed)
Pt discharged with printed instructions. Pt verbalized an understanding. No concerns noted. Lucyle Alumbaugh L Leiann Sporer, RN 
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
## 2018-08-07 NOTE — Progress Notes (Signed)
Pt tired, reports feels emotionally better than she did yesterday.  Feels sore and has mild HA, but no abd pain or other complaints.  Denies bleeding, LOF, denies feeling contractions.  Reports good FM. FHT: 135 mod var, Cat 1, occ variable TOCO: q13 with intermittent irrit SVE: 0/thick, unchanged A/P:  Pt has been monitored for approx 18 hrs FHT remain reassuring Pt not feeling contractions which have spaced out Has appt on Tues. Discussed d/c home, pt would like to go home.
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
## 2018-08-11 ENCOUNTER — Encounter (HOSPITAL_COMMUNITY): Payer: Self-pay

## 2018-08-17 ENCOUNTER — Observation Stay (HOSPITAL_COMMUNITY)
Admission: AD | Admit: 2018-08-17 | Discharge: 2018-08-18 | Disposition: A | Discharge status: Home / Self Care | Payer: 59 | Source: Ambulatory Visit | Attending: Obstetrics | Admitting: Obstetrics

## 2018-08-17 ENCOUNTER — Encounter (HOSPITAL_COMMUNITY): Payer: Self-pay

## 2018-08-17 DIAGNOSIS — Z79899 Other long term (current) drug therapy: Secondary | ICD-10-CM | POA: Diagnosis not present

## 2018-08-17 DIAGNOSIS — K5090 Crohn's disease, unspecified, without complications: Secondary | ICD-10-CM | POA: Insufficient documentation

## 2018-08-17 DIAGNOSIS — O99353 Diseases of the nervous system complicating pregnancy, third trimester: Secondary | ICD-10-CM | POA: Insufficient documentation

## 2018-08-17 DIAGNOSIS — Z3A35 35 weeks gestation of pregnancy: Secondary | ICD-10-CM | POA: Diagnosis not present

## 2018-08-17 DIAGNOSIS — Z8751 Personal history of pre-term labor: Secondary | ICD-10-CM | POA: Insufficient documentation

## 2018-08-17 DIAGNOSIS — O34219 Maternal care for unspecified type scar from previous cesarean delivery: Secondary | ICD-10-CM | POA: Insufficient documentation

## 2018-08-17 DIAGNOSIS — G40909 Epilepsy, unspecified, not intractable, without status epilepticus: Secondary | ICD-10-CM | POA: Diagnosis not present

## 2018-08-17 DIAGNOSIS — O99613 Diseases of the digestive system complicating pregnancy, third trimester: Secondary | ICD-10-CM | POA: Diagnosis not present

## 2018-08-17 DIAGNOSIS — O4703 False labor before 37 completed weeks of gestation, third trimester: Secondary | ICD-10-CM

## 2018-08-17 LAB — URINALYSIS, ROUTINE W REFLEX MICROSCOPIC
Bilirubin Urine: NEGATIVE
Glucose, UA: NEGATIVE mg/dL
Hgb urine dipstick: NEGATIVE
Ketones, ur: NEGATIVE mg/dL
Nitrite: NEGATIVE
Protein, ur: NEGATIVE mg/dL
Specific Gravity, Urine: 1.01 (ref 1.005–1.030)
pH: 7 (ref 5.0–8.0)

## 2018-08-17 LAB — COMPREHENSIVE METABOLIC PANEL
ALK PHOS: 159 U/L — AB (ref 38–126)
ALT: 15 U/L (ref 0–44)
AST: 22 U/L (ref 15–41)
Albumin: 2.9 g/dL — ABNORMAL LOW (ref 3.5–5.0)
Anion gap: 11 (ref 5–15)
BUN: 6 mg/dL (ref 6–20)
CALCIUM: 9.1 mg/dL (ref 8.9–10.3)
CO2: 21 mmol/L — AB (ref 22–32)
CREATININE: 0.6 mg/dL (ref 0.44–1.00)
Chloride: 100 mmol/L (ref 98–111)
Glucose, Bld: 69 mg/dL — ABNORMAL LOW (ref 70–99)
Potassium: 4.4 mmol/L (ref 3.5–5.1)
SODIUM: 132 mmol/L — AB (ref 135–145)
Total Bilirubin: 0.8 mg/dL (ref 0.3–1.2)
Total Protein: 6.4 g/dL — ABNORMAL LOW (ref 6.5–8.1)

## 2018-08-17 LAB — CBC
HCT: 39.1 % (ref 36.0–46.0)
Hemoglobin: 12.3 g/dL (ref 12.0–15.0)
MCH: 29.6 pg (ref 26.0–34.0)
MCHC: 31.5 g/dL (ref 30.0–36.0)
MCV: 94 fL (ref 80.0–100.0)
PLATELETS: 240 10*3/uL (ref 150–400)
RBC: 4.16 MIL/uL (ref 3.87–5.11)
RDW: 18 % — AB (ref 11.5–15.5)
WBC: 7.3 10*3/uL (ref 4.0–10.5)
nRBC: 0 % (ref 0.0–0.2)

## 2018-08-17 MED ORDER — MONTELUKAST SODIUM 10 MG PO TABS
10.0000 mg | ORAL_TABLET | Freq: Every day | ORAL | Status: DC
  Administered 2018-08-17: 10 mg via ORAL
  Filled 2018-08-17 (×2): qty 1

## 2018-08-17 MED ORDER — LORATADINE 10 MG PO TABS
10.0000 mg | ORAL_TABLET | Freq: Every day | ORAL | Status: DC
  Filled 2018-08-17 (×3): qty 1

## 2018-08-17 MED ORDER — LAMOTRIGINE 200 MG PO TABS: 600.0000 mg | ORAL_TABLET | Freq: Every day | ORAL | Status: DC

## 2018-08-17 MED ORDER — PENICILLIN G 3 MILLION UNITS IVPB - SIMPLE MED
3.0000 10*6.[IU] | INTRAVENOUS | Status: DC
  Filled 2018-08-17: qty 100

## 2018-08-17 MED ORDER — LIDOCAINE HCL (PF) 1 % IJ SOLN
30.0000 mL | INTRAMUSCULAR | Status: DC | PRN
  Filled 2018-08-17: qty 30

## 2018-08-17 MED ORDER — ONDANSETRON HCL 4 MG/2ML IJ SOLN: 4.0000 mg | Freq: Four times a day (QID) | INTRAMUSCULAR | Status: DC | PRN

## 2018-08-17 MED ORDER — LAMOTRIGINE ER 200 MG PO TB24
600.0000 mg | ORAL_TABLET | Freq: Every day | ORAL | Status: DC
  Administered 2018-08-17: 600 mg via ORAL

## 2018-08-17 MED ORDER — DOCUSATE SODIUM 100 MG PO CAPS
100.0000 mg | ORAL_CAPSULE | Freq: Every day | ORAL | Status: DC
  Administered 2018-08-18: 100 mg via ORAL
  Filled 2018-08-17: qty 1

## 2018-08-17 MED ORDER — PRENATAL MULTIVITAMIN CH
1.0000 | ORAL_TABLET | Freq: Every day | ORAL | Status: DC
  Administered 2018-08-18: 1 via ORAL
  Filled 2018-08-17: qty 1

## 2018-08-17 MED ORDER — OXYCODONE-ACETAMINOPHEN 5-325 MG PO TABS: 2.0000 | ORAL_TABLET | ORAL | Status: DC | PRN

## 2018-08-17 MED ORDER — LACTATED RINGERS IV SOLN: 500.0000 mL | INTRAVENOUS | Status: DC | PRN

## 2018-08-17 MED ORDER — SERTRALINE HCL 100 MG PO TABS
100.0000 mg | ORAL_TABLET | Freq: Every day | ORAL | Status: DC
  Administered 2018-08-17: 100 mg via ORAL
  Filled 2018-08-17 (×2): qty 1

## 2018-08-17 MED ORDER — OXYTOCIN 40 UNITS IN LACTATED RINGERS INFUSION - SIMPLE MED: 2.5000 [IU]/h | INTRAVENOUS | Status: DC

## 2018-08-17 MED ORDER — FAMOTIDINE 20 MG PO TABS: 20.0000 mg | ORAL_TABLET | Freq: Two times a day (BID) | ORAL | Status: DC | PRN

## 2018-08-17 MED ORDER — URSODIOL 300 MG PO CAPS
300.0000 mg | ORAL_CAPSULE | Freq: Two times a day (BID) | ORAL | Status: DC
  Administered 2018-08-17 – 2018-08-18 (×2): 300 mg via ORAL
  Filled 2018-08-17 (×4): qty 1

## 2018-08-17 MED ORDER — CALCIUM CARBONATE ANTACID 500 MG PO CHEW: 2.0000 | CHEWABLE_TABLET | ORAL | Status: DC | PRN

## 2018-08-17 MED ORDER — OXYCODONE-ACETAMINOPHEN 5-325 MG PO TABS: 1.0000 | ORAL_TABLET | ORAL | Status: DC | PRN

## 2018-08-17 MED ORDER — SODIUM CHLORIDE 0.9 % IV SOLN
5.0000 10*6.[IU] | Freq: Once | INTRAVENOUS | Status: DC
  Filled 2018-08-17: qty 5

## 2018-08-17 MED ORDER — BETAMETHASONE SOD PHOS & ACET 6 (3-3) MG/ML IJ SUSP
12.0000 mg | Freq: Once | INTRAMUSCULAR | Status: AC
  Administered 2018-08-18: 12 mg via INTRAMUSCULAR
  Filled 2018-08-17 (×2): qty 2

## 2018-08-17 MED ORDER — ACETAMINOPHEN 325 MG PO TABS
650.0000 mg | ORAL_TABLET | ORAL | Status: DC | PRN
  Administered 2018-08-17: 650 mg via ORAL
  Filled 2018-08-17: qty 2

## 2018-08-17 MED ORDER — ACETAMINOPHEN 325 MG PO TABS: 650.0000 mg | ORAL_TABLET | ORAL | Status: DC | PRN

## 2018-08-17 MED ORDER — OXYTOCIN BOLUS FROM INFUSION: 500.0000 mL | Freq: Once | INTRAVENOUS | Status: DC

## 2018-08-17 MED ORDER — LACTATED RINGERS IV BOLUS
1000.0000 mL | Freq: Once | INTRAVENOUS | Status: AC
  Administered 2018-08-17: 1000 mL via INTRAVENOUS

## 2018-08-17 MED ORDER — BETAMETHASONE SOD PHOS & ACET 6 (3-3) MG/ML IJ SUSP
12.0000 mg | Freq: Once | INTRAMUSCULAR | Status: AC
  Administered 2018-08-17: 12 mg via INTRAMUSCULAR
  Filled 2018-08-17: qty 2

## 2018-08-17 MED ORDER — AZATHIOPRINE 50 MG PO TABS
100.0000 mg | ORAL_TABLET | Freq: Every day | ORAL | Status: DC
  Administered 2018-08-17: 100 mg via ORAL
  Filled 2018-08-17 (×2): qty 2

## 2018-08-17 MED ORDER — FENTANYL CITRATE (PF) 100 MCG/2ML IJ SOLN: 100.0000 ug | INTRAMUSCULAR | Status: DC | PRN

## 2018-08-17 MED ORDER — ZOLPIDEM TARTRATE 5 MG PO TABS: 5.0000 mg | ORAL_TABLET | Freq: Every evening | ORAL | Status: DC | PRN

## 2018-08-17 MED ORDER — SOD CITRATE-CITRIC ACID 500-334 MG/5ML PO SOLN: 30.0000 mL | ORAL | Status: DC | PRN

## 2018-08-17 MED ORDER — LACTATED RINGERS IV SOLN
INTRAVENOUS | Status: DC
  Administered 2018-08-17: 125 mL/h via INTRAVENOUS
  Administered 2018-08-17: 14:00:00 via INTRAVENOUS

## 2018-08-17 NOTE — Progress Notes (Signed)
Pt seen and examined.  Contractions are less intense.  Has not desired any pain medication.    BP 120/80   Pulse 87   Temp 98.1 F (36.7 C) (Oral)   Resp 16   Ht 4' 11"  (1.499 m)   Wt 76.7 kg   BMI 34.13 kg/m   Toco: q4-8 min EFM: 130s, mod var, + accels, no decels SVE:  3 /long / posterior / high and ballotable  A&P: G3p1102 @ 26w4dw preterm contractions ontractions have lessened in intensity--will transfer to antepartum for observation / continuous monitoring overnight. History of cesarean section--desires TOLAC if labor Will complete bmz course FSR

## 2018-08-17 NOTE — Anesthesia Pain Management Evaluation Note (Signed)
  CRNA Pain Management Visit Note  Patient: Desiree Marshall, 37 y.o., female  "Hello I am a member of the anesthesia team at [facility]. We have an anesthesia team available at all times to provide care throughout the hospital, including epidural management and anesthesia for C-section. I don't know your plan for the delivery whether it a natural birth, water birth, IV sedation, nitrous supplementation, doula or epidural, but we want to meet your pain goals."   1.Was your pain managed to your expectations on prior hospitalizations?   Yes   2.What is your expectation for pain management during this hospitalization?     Epidural  3.How can we help you reach that goal? unsure  Record the patient's initial score and the patient's pain goal.   Pain: 6  Pain Goal: 10 [facility] wants you to be able to say your pain was always managed very well.  [name] 08/17/2018

## 2018-08-17 NOTE — MAU Note (Addendum)
Pt started contracting today around 830 and started getting regular around 1015. Having them about 2-4 min apart. Pain is rated 5/10. Possible leaking. No bleeding +FM Pt was seen in the office today and was 1cm. Was told if she kept having ctx to come to MAU

## 2018-08-17 NOTE — MAU Provider Note (Signed)
History     CSN: [id number]  Arrival date and time: 08/17/18 1154   First Provider Initiated Contact with Patient 08/17/18 1228      Chief Complaint  Patient presents with  . Contractions   Desiree Marshall is a 37 y.o. G3P2 at 11w4dwho presents to MAU with complaints of contractions. She reports contractions have been occurring since 0800 this morning, had office appointment at 0900 was noted to be 1cm and told if she was still contracting then present to MAU. She reports continued contractions that got closer together around 1015, reports contractions every 2-4 minutes that she is having to breath through. Rates pain 5/10. She denies vaginal bleeding or LOF. She reports losing her mucous plug. +FM.    OB History    Gravida  5   Para  4   Term  2   Preterm  2   AB  0   Living  2     SAB  0   TAB  0   Ectopic  0   Multiple      Live Births  2           Past Medical History:  Diagnosis Date  . Anemia    Resolved  . Astigmatism   . Chronic kidney disease   . [place]   . Eczema   . [place]    Diagnosed at the age of 19   . Gallstone   . Kidney stone   . Migraine   . [place]izures ([place]     Past Surgical History:  Procedure Laterality Date  . CESAREAN SECTION N/A 05/09/2014   Procedure: CESAREAN SECTION;  Surgeon: MDaria Pastures MD;  [facility];  Service: Obstetrics;  Laterality: N/A;  . CESAREAN SECTION    . CYSTOSCOPY WITH RETROGRADE PYELOGRAM, URETEROSCOPY AND STENT PLACEMENT Left 07/24/2016   Procedure: CYSTOSCOPY WITH RETROGRADE PYELOGRAM, URETEROSCOPY AND STENT PLACEMENT and laser lithotripsy;  Surgeon: MFestus Aloe MD;  Location: [facility];  Service: Urology;  Laterality: Left;  . HOLMIUM LASER APPLICATION Left 185/27/7824  Procedure: HOLMIUM LASER APPLICATION;  Surgeon: MFestus Aloe MD;  Location: [facility];  Service: Urology;  Laterality: Left;    Family History  Problem Relation Age of Onset  . Asthma  Mother   . Hypertension Mother   . Allergic rhinitis Mother   . Cancer Father 522      Prostate Cancer  . Heart disease Maternal Grandmother   . Heart disease Maternal Grandfather   . Stroke Paternal Grandmother   . Heart disease Paternal Grandfather   . Allergic rhinitis Brother   . Angioedema Neg Hx   . Atopy Neg Hx   . Eczema Neg Hx   . Immunodeficiency Neg Hx   . Urticaria Neg Hx     Social History   Tobacco Use  . Smoking status: Never Smoker  . Smokeless tobacco: Never Used  Substance Use Topics  . Alcohol use: Not Currently  . Drug use: Never    Allergies:  Allergies  Allergen Reactions  . Promethazine Other (See Comments)    hallucinations  . Promethazine Hcl Other (See Comments)    Pt states that this medication causes hallucinations.     Medications Prior to Admission  Medication Sig Dispense Refill Last Dose  . acetaminophen (TYLENOL) 325 MG tablet Take 650 mg by mouth every 6 (six) hours as needed for mild pain.   unk  . acetaminophen (TYLENOL) 500 MG  tablet Take 1,000 mg by mouth every 6 (six) hours as needed for mild pain.    Taking  . azaTHIOprine (IMURAN) 50 MG tablet    Taking  . azaTHIOprine (IMURAN) 50 MG tablet Take 100 mg by mouth at bedtime.   08/05/2018 at Unknown time  . calcium carbonate (TUMS - DOSED IN MG ELEMENTAL CALCIUM) 500 MG chewable tablet Chew 1 tablet by mouth daily as needed for indigestion or heartburn.   Taking  . calcium carbonate (TUMS - DOSED IN MG ELEMENTAL CALCIUM) 500 MG chewable tablet Chew 1 tablet by mouth daily as needed for indigestion or heartburn.   08/05/2018 at Unknown time  . cholecalciferol (VITAMIN D) 1000 UNITS tablet Take 2,000 Units by mouth daily.    Taking  . Cholecalciferol (VITAMIN D) 50 MCG (2000 UT) tablet Take 2,000 Units by mouth at bedtime.   08/05/2018 at Unknown time  . famotidine (PEPCID) 10 MG tablet Take 10 mg by mouth every evening.   08/05/2018 at Unknown time  . ferrous sulfate 325 (65 FE) MG  tablet Take 325 mg by mouth at bedtime.   08/05/2018 at Unknown time  . folic acid (FOLVITE) 923 MCG tablet Take 3,200 mcg by mouth daily.    Taking  . hydrOXYzine (ATARAX/VISTARIL) 25 MG tablet Take 1 tablet (25 mg total) by mouth every 6 (six) hours. 12 tablet 0   . InFLIXimab (REMICADE IV) Remicade   Taking  . LamoTRIgine 100 MG TB24 24 hour tablet Take 1 tablet daily with 210m tablet for total dose of 3010mdaily 90 tablet 3   . LamoTRIgine 200 MG TB24 24 hour tablet TAKE 3 TABLETS AT BEDTIME 270 tablet 3   . LamoTRIgine 200 MG TB24 24 hour tablet Take 600 mg by mouth at bedtime.  1 08/05/2018 at Unknown time  . loratadine (CLARITIN) 10 MG tablet Take 10 mg by mouth at bedtime.   08/05/2018 at Unknown time  . montelukast (SINGULAIR) 10 MG tablet Take 10 mg by mouth every evening.    Taking  . montelukast (SINGULAIR) 10 MG tablet Take 10 mg by mouth at bedtime.   08/05/2018 at Unknown time  . Prenatal Vit-Fe Fumarate-FA (PRENATAL MULTIVITAMIN) TABS tablet Take 1 tablet by mouth at bedtime.   08/05/2018 at Unknown time  . sertraline (ZOLOFT) 50 MG tablet Take 75 mg by mouth at bedtime.    Taking  . SUMAtriptan (IMITREX) 100 MG tablet TK 1/2 TO 1 T PO PRF MIGRAINE. MAY REPEAT 1/2 TO 1 T IN 2 HOURS PRN. NOT TO EXCEED 2 TS IN 24 HOURS.  5 Taking  . ursodiol (ACTIGALL) 500 MG tablet Take 500 mg by mouth 2 (two) times daily.  12 08/05/2018 at 2300    Review of Systems  Constitutional: Negative.   Respiratory: Negative.   Cardiovascular: Negative.   Gastrointestinal: Positive for abdominal pain. Negative for constipation, diarrhea, nausea and vomiting.  Genitourinary: Negative.   Musculoskeletal: Negative.    Physical Exam   Blood pressure 122/78, pulse 94, temperature 98.3 F (36.8 C), temperature source Oral, resp. rate 16, weight 78 kg, unknown if currently breastfeeding.  Physical Exam  Nursing note and vitals reviewed. Constitutional: She is oriented to person, place, and time. She appears  well-developed and well-nourished. No distress.  Cardiovascular: Normal rate, regular rhythm and normal heart sounds.  Respiratory: Effort normal and breath sounds normal. No respiratory distress. She has no wheezes.  GI:  Gravid appropriate for gestational age, moderate contractions palpated  Musculoskeletal: She exhibits no edema.  Neurological: She is alert and oriented to person, place, and time. She displays normal reflexes. She exhibits normal muscle tone.   Dilation: 3 Effacement (%): 50 Station: -2 Presentation: Vertex Exam by:: Wende Bushy CNM  MAU Course  Procedures  MDM IV LR bolus  Betamethasone  Recheck cervix in 1 hour   Slight cervical change after reassessment at 1 hour. Cervix 3.5cm/50.  Patient reports continuation of breathing through contractions, rates pain 5/10.   FHR: 140/moderate/+accels/ no decelerations Toco: 2-4 minutes/ moderate by palpation   Consulted with attending: Dr Harolyn Rutherford, recommends admission to L&D.  Admit to L&D for observation. Discussed plan of care with patient. Dr Carlis Abbott consulted and aware. Orders placed by Dr Carlis Abbott.  Assessment and Plan   1. Threatened preterm labor   Admit to L&D  Orders placed by Dr Carlis Abbott Care taken over by Dr Carlis Abbott   Lajean Manes, CNM 08/17/18, 2:10PM

## 2018-08-17 NOTE — H&P (Signed)
37 y.o. [id number] @ 42w4dwho presented to MAU with complaints of contractions. She reports contractions have been occurring since 0800 this morning, had office appointment at 0900 was noted to be 1cm.  The contractions then got closer together around 1015, now every 2-4 minutes that she is having to breath through. Rates pain 5/10. She denies vaginal bleeding or LOF. She reports losing her mucous plug. +FM.  Pregnancy c/b: 1. Cholestasis of pregnancy: bile acids >90 at 33 weeks, on ursodiol 2. Crohn's disease: on remicade, azathioprine 3. History of cesarean section: G2 for NRFS at 10cm.  Desires TOLAC.   4. Epilepsy: on lamictal, followed by neurologist 5. History of PTD: at 36 weeks, PPROM,   Past Medical History:  Diagnosis Date  . Anemia    Resolved  . Astigmatism   . Chronic kidney disease   . [place]'s disease ([place]   . Eczema   . Epilepsy ([facility]    Diagnosed at the age of 89   . Gallstone   . Kidney stone   . Migraine   . Seizures ([facility] [facility]     Past Surgical History:  Procedure Laterality Date  . CESAREAN SECTION N/A 05/09/2014   Procedure: CESAREAN SECTION;  Surgeon: MDaria Pastures MD;  [facility];  Service: Obstetrics;  Laterality: N/A;  . CESAREAN SECTION    . CYSTOSCOPY WITH RETROGRADE PYELOGRAM, URETEROSCOPY AND STENT PLACEMENT Left 07/24/2016   Procedure: CYSTOSCOPY WITH RETROGRADE PYELOGRAM, URETEROSCOPY AND STENT PLACEMENT and laser lithotripsy;  Surgeon: MFestus Aloe MD;  Location[facility];  Service: Urology;  Laterality: Left;  . HOLMIUM LASER APPLICATION Left 112/87/8676  Procedure: HOLMIUM LASER APPLICATION;  Surgeon: MFestus Aloe MD;  Location: [facility];  Service: Urology;  Laterality: Left;    OB History  Gravida Para Term Preterm AB Living  3 2 1 1  0 2  SAB TAB Ectopic Multiple Live Births  0 0 0   2    # Outcome Date GA Lbr Len/2nd Weight Sex Delivery Anes PTL Lv  3 Current           2 Term 05/09/14 349w6d1:02 / 01:55 3395 g M CS-LVertical  EPI  LIV  1 Preterm 04/18/12 3694w2d455 g M Vag-Forceps EPI  LIV    Social History   Socioeconomic History  . Marital status: Married    Spouse name: CliOzzie Hoyle. Number of children: 1  . Years of education: Masters  . Highest education level: Not on file  Occupational History  . Occupation: HOMEMAKER   Tobacco Use  . Smoking status: Never Smoker  . Smokeless tobacco: Never Used  Substance and Sexual Activity  . Alcohol use: Not Currently  . Drug use: Never  . Sexual activity: Yes    Partners: Male    Birth control/protection: None  Lifestyle  . Physical activity:    Days per week: Not on file    Minutes per session: Not on file  . Stress: Not on file   Promethazine hcl    Prenatal Transfer Tool  Maternal Diabetes: No Genetic Screening: Declined Maternal Ultrasounds/Referrals: Normal Fetal Ultrasounds or other Referrals:  None Maternal Substance Abuse:  No Significant Maternal Medications:  Meds include: Other:  see above in H&P Significant Maternal Lab Results: Lab values include: Other:  gbs unknown  ABO, Rh: --/--/A NEG (11/19 1251) Antibody: POS (11/19 1251) Rubella: Immune (05/23 0000) RPR: Nonreactive (05/23 0000)  HBsAg: Negative (05/23 0000)  HIV: Non-reactive (05/23 0000)  GBS:  Unknown    Vitals:   08/17/18 1701 08/17/18 1731  BP: 126/71 120/80  Pulse: 93 87  Resp: 16 16  Temp:       General:  NAD Abdomen:  soft, gravid SVE:  3/50/ballotable/posterior per RN FHTs:  140s, mod var, Cat 1 Toco:  q3-4 min   A/P   37 y.o. [id number] 78w4dpresents with preterm uterine contractions Cervical change from 1 to 3 cm.  Contracting regular, but stable at 3cm at this time.  Given preterm gestation, BMZ series now and pcn for gbs unknown.    GBS collected in MAU FSR/ vtx [place]

## 2018-08-18 ENCOUNTER — Other Ambulatory Visit: Payer: Self-pay

## 2018-08-18 LAB — RPR: RPR: NONREACTIVE

## 2018-08-18 NOTE — Progress Notes (Signed)
Patient ID: Desiree Marshall, female   DOB: Jan 22, 1981, 37 y.o.   MRN: [id number]  HD#2 for obs for threatened PTL S: Pt states contractions more noticeable when shes laying down, less so with standing. Active FM. No VB or LOF O:  Vitals:   08/18/18 0004 08/18/18 0434 08/18/18 0803 08/18/18 1116  BP: 121/71 116/82 117/71 112/76  Pulse: (!) 114 (!) 105 99 (!) 111  Resp: 16 18 17 16   Temp: 98.3 F (36.8 C) 97.9 F (36.6 C) 98.3 F (36.8 C) 98 F (36.7 C)  TempSrc: Oral Oral Oral   SpO2: 97% 99% 99% 98%  Weight:      Height:       AOX3, NAD FHR 140 reactive, cat 1 tracing Toco irritability Cvx def  A/P 1) Only uterine irritability noted on toco. Will recheck cervix around lunch time. If stable will likely d/c home after receives 2nd BMZ injection 2) FWB reassuring

## 2018-08-18 NOTE — Plan of Care (Signed)
  Problem: Health Behavior/Discharge Planning: Goal: Ability to manage health-related needs will improve Outcome: Progressing   Problem: Clinical Measurements: Goal: Ability to maintain clinical measurements within normal limits will improve Outcome: Progressing   

## 2018-08-18 NOTE — Discharge Summary (Signed)
Obstetric Discharge Summary Reason for Admission: observation/evaluation Prenatal Procedures: NST and ultrasound Intrapartum Procedures: NA, undelivered Postpartum Procedures: NA, undelivered Complications-Operative and Postpartum: NA, undelivered Hemoglobin  Date Value Ref Range Status  08/17/2018 12.3 12.0 - 15.0 g/dL Final   HCT  Date Value Ref Range Status  08/17/2018 39.1 36.0 - 46.0 % Final    Physical Exam:  General: alert, cooperative and appears stated age NST reactive, cat 1 tracing cvx unchanged, 3/th/-3 toco irritability  Discharge Diagnoses: False labor  Discharge Information: Date: 08/18/2018 Activity: pelvic rest Diet: routine Medications: Restart prior meds Condition: stable Instructions: refer to practice specific booklet Discharge to: home   Desiree Marshall 08/18/2018, 12:47 PM

## 2018-08-18 NOTE — Progress Notes (Signed)
Discharge instructions explained and given to patient in writing. She verbalizes understanding of signs and symptoms to return to MAU.

## 2018-08-20 LAB — CULTURE, BETA STREP (GROUP B ONLY)

## 2018-08-21 ENCOUNTER — Inpatient Hospital Stay (HOSPITAL_COMMUNITY): Payer: 59 | Admitting: Anesthesiology

## 2018-08-21 ENCOUNTER — Encounter (HOSPITAL_COMMUNITY): Payer: Self-pay

## 2018-08-21 ENCOUNTER — Inpatient Hospital Stay (HOSPITAL_COMMUNITY)
Admission: AD | Admit: 2018-08-21 | Discharge: 2018-08-24 | DRG: 787 | Disposition: A | Discharge status: Home / Self Care | Payer: 59 | Attending: Obstetrics and Gynecology | Admitting: Obstetrics and Gynecology

## 2018-08-21 ENCOUNTER — Encounter (HOSPITAL_COMMUNITY)
Admission: AD | Disposition: A | Discharge status: Home / Self Care | Payer: Self-pay | Source: Home / Self Care | Attending: Obstetrics and Gynecology

## 2018-08-21 ENCOUNTER — Other Ambulatory Visit: Payer: Self-pay

## 2018-08-21 DIAGNOSIS — O2662 Liver and biliary tract disorders in childbirth: Secondary | ICD-10-CM | POA: Diagnosis present

## 2018-08-21 DIAGNOSIS — K5090 Crohn's disease, unspecified, without complications: Secondary | ICD-10-CM | POA: Diagnosis present

## 2018-08-21 DIAGNOSIS — Z87442 Personal history of urinary calculi: Secondary | ICD-10-CM | POA: Diagnosis not present

## 2018-08-21 DIAGNOSIS — O34211 Maternal care for low transverse scar from previous cesarean delivery: Secondary | ICD-10-CM | POA: Diagnosis present

## 2018-08-21 DIAGNOSIS — G40909 Epilepsy, unspecified, not intractable, without status epilepticus: Secondary | ICD-10-CM | POA: Diagnosis present

## 2018-08-21 DIAGNOSIS — K8020 Calculus of gallbladder without cholecystitis without obstruction: Secondary | ICD-10-CM | POA: Diagnosis present

## 2018-08-21 DIAGNOSIS — O99344 Other mental disorders complicating childbirth: Secondary | ICD-10-CM | POA: Diagnosis present

## 2018-08-21 DIAGNOSIS — Z9889 Other specified postprocedural states: Secondary | ICD-10-CM

## 2018-08-21 DIAGNOSIS — O9962 Diseases of the digestive system complicating childbirth: Secondary | ICD-10-CM | POA: Diagnosis present

## 2018-08-21 DIAGNOSIS — Z3A36 36 weeks gestation of pregnancy: Secondary | ICD-10-CM | POA: Diagnosis not present

## 2018-08-21 DIAGNOSIS — F329 Major depressive disorder, single episode, unspecified: Secondary | ICD-10-CM | POA: Diagnosis present

## 2018-08-21 DIAGNOSIS — O99354 Diseases of the nervous system complicating childbirth: Secondary | ICD-10-CM | POA: Diagnosis present

## 2018-08-21 LAB — CBC
HEMATOCRIT: 36.8 % (ref 36.0–46.0)
HEMOGLOBIN: 11.5 g/dL — AB (ref 12.0–15.0)
MCH: 29.3 pg (ref 26.0–34.0)
MCHC: 31.3 g/dL (ref 30.0–36.0)
MCV: 93.9 fL (ref 80.0–100.0)
Platelets: 210 10*3/uL (ref 150–400)
RBC: 3.92 MIL/uL (ref 3.87–5.11)
RDW: 18.1 % — ABNORMAL HIGH (ref 11.5–15.5)
WBC: 8.8 10*3/uL (ref 4.0–10.5)
nRBC: 3.1 % — ABNORMAL HIGH (ref 0.0–0.2)

## 2018-08-21 LAB — URINALYSIS, ROUTINE W REFLEX MICROSCOPIC
BILIRUBIN URINE: NEGATIVE
Bacteria, UA: NONE SEEN
GLUCOSE, UA: NEGATIVE mg/dL
Hgb urine dipstick: NEGATIVE
KETONES UR: NEGATIVE mg/dL
NITRITE: NEGATIVE
Protein, ur: NEGATIVE mg/dL
SPECIFIC GRAVITY, URINE: 1.005 (ref 1.005–1.030)
pH: 8 (ref 5.0–8.0)

## 2018-08-21 LAB — TYPE AND SCREEN
ABO/RH(D): A NEG
ABO/RH(D): A NEG
Antibody Screen: POSITIVE
Antibody Screen: NEGATIVE
Unit division: 0
Unit division: 0

## 2018-08-21 LAB — BPAM RBC
BLOOD PRODUCT EXPIRATION DATE: 201912192359
BLOOD PRODUCT EXPIRATION DATE: 201912192359
UNIT TYPE AND RH: 600
Unit Type and Rh: 600

## 2018-08-21 LAB — RPR: RPR Ser Ql: NONREACTIVE

## 2018-08-21 SURGERY — Surgical Case
Anesthesia: Epidural

## 2018-08-21 MED ORDER — FAMOTIDINE 20 MG PO TABS
20.0000 mg | ORAL_TABLET | Freq: Two times a day (BID) | ORAL | Status: DC
  Administered 2018-08-21: 20 mg via ORAL
  Filled 2018-08-21: qty 1

## 2018-08-21 MED ORDER — MORPHINE SULFATE (PF) 0.5 MG/ML IJ SOLN
INTRAMUSCULAR | Status: AC
  Filled 2018-08-21: qty 10

## 2018-08-21 MED ORDER — BUTORPHANOL TARTRATE 1 MG/ML IJ SOLN
1.0000 mg | INTRAMUSCULAR | Status: DC | PRN
  Administered 2018-08-21: 1 mg via INTRAVENOUS
  Filled 2018-08-21: qty 1

## 2018-08-21 MED ORDER — ONDANSETRON HCL 4 MG/2ML IJ SOLN: 4.0000 mg | Freq: Four times a day (QID) | INTRAMUSCULAR | Status: DC | PRN

## 2018-08-21 MED ORDER — PHENYLEPHRINE 40 MCG/ML (10ML) SYRINGE FOR IV PUSH (FOR BLOOD PRESSURE SUPPORT): 80.0000 ug | PREFILLED_SYRINGE | INTRAVENOUS | Status: DC | PRN

## 2018-08-21 MED ORDER — ONDANSETRON HCL 4 MG/2ML IJ SOLN
INTRAMUSCULAR | Status: DC | PRN
  Administered 2018-08-21: 4 mg via INTRAVENOUS

## 2018-08-21 MED ORDER — OXYCODONE-ACETAMINOPHEN 5-325 MG PO TABS: 2.0000 | ORAL_TABLET | ORAL | Status: DC | PRN

## 2018-08-21 MED ORDER — LACTATED RINGERS IV SOLN
INTRAVENOUS | Status: DC
  Administered 2018-08-21: 19:00:00 via INTRAVENOUS
  Administered 2018-08-21: 125 mL/h via INTRAVENOUS
  Administered 2018-08-21: 04:00:00 via INTRAVENOUS
  Administered 2018-08-21: 125 mL/h via INTRAVENOUS

## 2018-08-21 MED ORDER — AZATHIOPRINE 50 MG PO TABS
100.0000 mg | ORAL_TABLET | Freq: Every day | ORAL | Status: DC
  Administered 2018-08-21: 100 mg via ORAL
  Filled 2018-08-21: qty 2

## 2018-08-21 MED ORDER — SCOPOLAMINE 1 MG/3DAYS TD PT72
MEDICATED_PATCH | TRANSDERMAL | Status: DC | PRN
  Administered 2018-08-21: 1 via TRANSDERMAL

## 2018-08-21 MED ORDER — STERILE WATER FOR IRRIGATION IR SOLN
Status: DC | PRN
  Administered 2018-08-21: 1000 mL

## 2018-08-21 MED ORDER — OXYTOCIN 10 UNIT/ML IJ SOLN
INTRAVENOUS | Status: DC | PRN
  Administered 2018-08-21: 40 [IU] via INTRAVENOUS

## 2018-08-21 MED ORDER — FENTANYL 2.5 MCG/ML BUPIVACAINE 1/10 % EPIDURAL INFUSION (WH - ANES)
14.0000 mL/h | INTRAMUSCULAR | Status: DC | PRN
  Administered 2018-08-21 (×2): 14 mL/h via EPIDURAL
  Filled 2018-08-21 (×2): qty 100

## 2018-08-21 MED ORDER — OXYCODONE-ACETAMINOPHEN 5-325 MG PO TABS: 1.0000 | ORAL_TABLET | ORAL | Status: DC | PRN

## 2018-08-21 MED ORDER — LIDOCAINE HCL (PF) 1 % IJ SOLN
INTRAMUSCULAR | Status: DC | PRN
  Administered 2018-08-21: 5 mL
  Administered 2018-08-21: 5 mL via EPIDURAL

## 2018-08-21 MED ORDER — OXYTOCIN BOLUS FROM INFUSION: 500.0000 mL | Freq: Once | INTRAVENOUS | Status: DC

## 2018-08-21 MED ORDER — PHENYLEPHRINE HCL 10 MG/ML IJ SOLN
INTRAMUSCULAR | Status: DC | PRN
  Administered 2018-08-21 – 2018-08-22 (×6): 80 ug via INTRAVENOUS

## 2018-08-21 MED ORDER — LAMOTRIGINE ER 200 MG PO TB24
600.0000 mg | ORAL_TABLET | Freq: Every day | ORAL | Status: DC
  Administered 2018-08-21: 600 mg via ORAL

## 2018-08-21 MED ORDER — EPHEDRINE 5 MG/ML INJ: 10.0000 mg | INTRAVENOUS | Status: DC | PRN

## 2018-08-21 MED ORDER — FLEET ENEMA 7-19 GM/118ML RE ENEM: 1.0000 | ENEMA | Freq: Every day | RECTAL | Status: DC | PRN

## 2018-08-21 MED ORDER — LORATADINE 10 MG PO TABS
10.0000 mg | ORAL_TABLET | Freq: Every day | ORAL | Status: DC
  Administered 2018-08-21: 10 mg via ORAL
  Filled 2018-08-21: qty 1

## 2018-08-21 MED ORDER — LACTATED RINGERS IV SOLN
INTRAVENOUS | Status: DC | PRN
  Administered 2018-08-21 – 2018-08-22 (×2): via INTRAVENOUS

## 2018-08-21 MED ORDER — TERBUTALINE SULFATE 1 MG/ML IJ SOLN: 0.2500 mg | Freq: Once | INTRAMUSCULAR | Status: DC | PRN

## 2018-08-21 MED ORDER — LIDOCAINE HCL (PF) 1 % IJ SOLN: 30.0000 mL | INTRAMUSCULAR | Status: DC | PRN

## 2018-08-21 MED ORDER — OXYTOCIN 40 UNITS IN LACTATED RINGERS INFUSION - SIMPLE MED
2.5000 [IU]/h | INTRAVENOUS | Status: DC
  Filled 2018-08-21: qty 1000

## 2018-08-21 MED ORDER — SERTRALINE HCL 100 MG PO TABS
100.0000 mg | ORAL_TABLET | Freq: Every day | ORAL | Status: DC
  Administered 2018-08-21: 100 mg via ORAL
  Filled 2018-08-21: qty 1

## 2018-08-21 MED ORDER — DIPHENHYDRAMINE HCL 50 MG/ML IJ SOLN: 12.5000 mg | INTRAMUSCULAR | Status: DC | PRN

## 2018-08-21 MED ORDER — LIDOCAINE-EPINEPHRINE (PF) 2 %-1:200000 IJ SOLN
INTRAMUSCULAR | Status: DC | PRN
  Administered 2018-08-21: 5 mL via INTRADERMAL

## 2018-08-21 MED ORDER — LACTATED RINGERS IV SOLN
INTRAVENOUS | Status: DC | PRN
  Administered 2018-08-21: via INTRAVENOUS

## 2018-08-21 MED ORDER — LACTATED RINGERS IV SOLN
500.0000 mL | INTRAVENOUS | Status: DC | PRN
  Administered 2018-08-21: 500 mL via INTRAVENOUS

## 2018-08-21 MED ORDER — MORPHINE SULFATE (PF) 0.5 MG/ML IJ SOLN
INTRAMUSCULAR | Status: DC | PRN
  Administered 2018-08-21: 3 mg via EPIDURAL

## 2018-08-21 MED ORDER — LACTATED RINGERS IV SOLN: INTRAVENOUS | Status: DC | PRN

## 2018-08-21 MED ORDER — LACTATED RINGERS IV SOLN: 500.0000 mL | Freq: Once | INTRAVENOUS | Status: DC

## 2018-08-21 MED ORDER — SOD CITRATE-CITRIC ACID 500-334 MG/5ML PO SOLN
30.0000 mL | ORAL | Status: DC | PRN
  Administered 2018-08-21: 30 mL via ORAL
  Filled 2018-08-21: qty 15

## 2018-08-21 MED ORDER — PHENYLEPHRINE 40 MCG/ML (10ML) SYRINGE FOR IV PUSH (FOR BLOOD PRESSURE SUPPORT)
80.0000 ug | PREFILLED_SYRINGE | INTRAVENOUS | Status: DC | PRN
  Filled 2018-08-21: qty 10

## 2018-08-21 MED ORDER — URSODIOL 500 MG PO TABS
500.0000 mg | ORAL_TABLET | Freq: Two times a day (BID) | ORAL | Status: DC
  Administered 2018-08-21 (×2): 500 mg via ORAL
  Filled 2018-08-21: qty 1

## 2018-08-21 MED ORDER — DEXAMETHASONE SODIUM PHOSPHATE 4 MG/ML IJ SOLN
INTRAMUSCULAR | Status: DC | PRN
  Administered 2018-08-21: 4 mg via INTRAVENOUS

## 2018-08-21 MED ORDER — OXYTOCIN 40 UNITS IN LACTATED RINGERS INFUSION - SIMPLE MED
1.0000 m[IU]/min | INTRAVENOUS | Status: DC
Start: 2018-08-21 — End: 2018-08-22
  Administered 2018-08-21: 1 m[IU]/min via INTRAVENOUS

## 2018-08-21 MED ORDER — ACETAMINOPHEN 325 MG PO TABS: 650.0000 mg | ORAL_TABLET | ORAL | Status: DC | PRN

## 2018-08-21 MED ORDER — MONTELUKAST SODIUM 10 MG PO TABS
10.0000 mg | ORAL_TABLET | Freq: Every day | ORAL | Status: DC
  Administered 2018-08-21: 10 mg via ORAL
  Filled 2018-08-21: qty 1

## 2018-08-21 SURGICAL SUPPLY — 36 items
APL SKNCLS STERI-STRIP NONHPOA (GAUZE/BANDAGES/DRESSINGS) ×1
BENZOIN TINCTURE PRP APPL 2/3 (GAUZE/BANDAGES/DRESSINGS) ×3 IMPLANT
CHLORAPREP W/TINT 26ML (MISCELLANEOUS) ×3 IMPLANT
CLAMP CORD UMBIL (MISCELLANEOUS) IMPLANT
CLOSURE STERI-STRIP 1/2X4 (GAUZE/BANDAGES/DRESSINGS) ×1
CLOSURE WOUND 1/2 X4 (GAUZE/BANDAGES/DRESSINGS) ×1
CLOTH BEACON ORANGE TIMEOUT ST (SAFETY) ×3 IMPLANT
CLSR STERI-STRIP ANTIMIC 1/2X4 (GAUZE/BANDAGES/DRESSINGS) ×1 IMPLANT
DRSG OPSITE POSTOP 4X10 (GAUZE/BANDAGES/DRESSINGS) ×3 IMPLANT
ELECT REM PT RETURN 9FT ADLT (ELECTROSURGICAL) ×3
ELECTRODE REM PT RTRN 9FT ADLT (ELECTROSURGICAL) ×1 IMPLANT
EXTRACTOR VACUUM BELL STYLE (SUCTIONS) IMPLANT
GLOVE BIO SURGEON STRL SZ7 (GLOVE) ×3 IMPLANT
GLOVE BIOGEL PI IND STRL 7.0 (GLOVE) ×1 IMPLANT
GLOVE BIOGEL PI INDICATOR 7.0 (GLOVE) ×2
GOWN STRL REUS W/TWL LRG LVL3 (GOWN DISPOSABLE) ×6 IMPLANT
KIT ABG SYR 3ML LUER SLIP (SYRINGE) IMPLANT
NDL HYPO 25X5/8 SAFETYGLIDE (NEEDLE) IMPLANT
NEEDLE HYPO 25X5/8 SAFETYGLIDE (NEEDLE) IMPLANT
NS IRRIG 1000ML POUR BTL (IV SOLUTION) ×3 IMPLANT
PACK C SECTION WH (CUSTOM PROCEDURE TRAY) ×3 IMPLANT
PAD OB MATERNITY 4.3X12.25 (PERSONAL CARE ITEMS) ×3 IMPLANT
PENCIL SMOKE EVAC W/HOLSTER (ELECTROSURGICAL) ×3 IMPLANT
RTRCTR C-SECT PINK 25CM LRG (MISCELLANEOUS) ×3 IMPLANT
SPONGE LAP 18X18 RF (DISPOSABLE) ×9 IMPLANT
STRIP CLOSURE SKIN 1/2X4 (GAUZE/BANDAGES/DRESSINGS) ×2 IMPLANT
SUT MNCRL 0 VIOLET CTX 36 (SUTURE) ×2 IMPLANT
SUT MONOCRYL 0 CTX 36 (SUTURE) ×4
SUT PLAIN 2 0 XLH (SUTURE) IMPLANT
SUT VIC AB 0 CT1 27 (SUTURE) ×6
SUT VIC AB 0 CT1 27XBRD ANBCTR (SUTURE) ×2 IMPLANT
SUT VIC AB 2-0 CT1 27 (SUTURE) ×3
SUT VIC AB 2-0 CT1 TAPERPNT 27 (SUTURE) ×1 IMPLANT
SUT VIC AB 4-0 KS 27 (SUTURE) ×3 IMPLANT
TOWEL OR 17X24 6PK STRL BLUE (TOWEL DISPOSABLE) ×3 IMPLANT
TRAY FOLEY W/BAG SLVR 14FR LF (SET/KITS/TRAYS/PACK) ×3 IMPLANT

## 2018-08-21 NOTE — MAU Note (Signed)
Pt has been contracting 2-7 mins apart since 2315. Is scheduled VBAC. Was 3/50/ballotable. Denies LOF/bleeding. +FM.

## 2018-08-21 NOTE — H&P (Signed)
37 y.o. [id number] 64w1dwith preterm contractions taking her breath away. Small amount of cervical change seen in MAU.  Pt resting now without epidural.    Pregnancy c/b: 1. Cholestasis of pregnancy: bile acids >90 at 35 weeks, now down to 35, on ursodiol 2. Crohn's disease: on remicade, azathioprine 3. History of cesarean section: G2 for NRFS at 10cm.  Desires TOLAC.   4. Epilepsy: on lamictal, followed by neurologist 5. History of PTD: at 35 weeks, PPROM,       Past Medical History:  Diagnosis Date  . Anemia    Resolved  . Astigmatism   . Chronic kidney disease   . [place]hn's disease ([facility] [place]   . Eczema   . Epilepsy ([facility]    Diagnosed at the age of 37   . Gallstone   . Kidney stone   . Migraine   . Seizures ([facility]          Past Surgical History:  Procedure Laterality Date  . CESAREAN SECTION N/A 05/09/2014   Procedure: CESAREAN SECTION;  Surgeon: MDaria Pastures MD;  [facility];  Service: Obstetrics;  Laterality: N/A;  . CESAREAN SECTION    . CYSTOSCOPY WITH RETROGRADE PYELOGRAM, URETEROSCOPY AND STENT PLACEMENT Left 07/24/2016   Procedure: CYSTOSCOPY WITH RETROGRADE PYELOGRAM, URETEROSCOPY AND STENT PLACEMENT and laser lithotripsy;  Surgeon: MFestus Aloe MD;  [facility];  Service: Urology;  Laterality: Left;  . HOLMIUM LASER APPLICATION Left 125/36/6440  Procedure: HOLMIUM LASER APPLICATION;  Surgeon: MFestus Aloe MD;  [facility];  Service: Urology;  Laterality: Left;                    OB History  Gravida Para Term Preterm AB Living  3 2 1 1  0 2  SAB TAB Ectopic Multiple Live Births     0 0 0   2       # Outcome Date GA Lbr Len/2nd Weight Sex Delivery Anes PTL Lv  3 Current           2 Term 05/09/14 329w6d1:02 / 01:55 3395 g M CS-LVertical EPI  LIV  1 Preterm 04/18/12 3670w2d455 g M Vag-Forceps EPI  LIV    Social History        Socioeconomic History  . Marital status: Married    Spouse name: CliOzzie Hoyle.  Number of children: 1  . Years of education: Masters  . Highest education level: Not on file  Occupational History  . Occupation: HOMEMAKER   Tobacco Use  . Smoking status: Never Smoker  . Smokeless tobacco: Never Used  Substance and Sexual Activity  . Alcohol use: Not Currently  . Drug use: Never  . Sexual activity: Yes    Partners: Male    Birth control/protection: None  Lifestyle  . Physical activity:    Days per week: Not on file    Minutes per session: Not on file  . Stress: Not on file   Promethazine hcl    Prenatal Transfer Tool  Maternal Diabetes: No Genetic Screening: Declined Maternal Ultrasounds/Referrals: Normal Fetal Ultrasounds or other Referrals:  None Maternal Substance Abuse:  No Significant Maternal Medications:  Meds include: Other:  see above in H&P Significant Maternal Lab Results: Lab values include: Other:  gbs unknown  ABO, Rh: --/--/A NEG (11/19 1251) Antibody: POS (11/19 1251) Rubella: Immune (05/23 0000) RPR: Nonreactive (05/23 0000)  HBsAg: Negative (05/23 0000)  HIV: Non-reactive (05/23 0000)  GBS:  Unknown    Vitals:   08/21/18 0313 08/21/18 0424 08/21/18 0528 08/21/18 0631  BP: 135/82 121/74 102/77 116/69  Pulse: 79 79 77 73  Resp: 18 18 18 18   Temp:  97.9 F (36.6 C)    TempSrc:  Axillary    SpO2: 99%     Weight:      Height:        General:  NAD Abdomen:  soft, gravid SVE:  3/50/ballotable/posterior per RN FHTs:  140s, gSTV, NST R Cat 1 Toco:  q 7 min   A/P   37 y.o. [id number] 80w4dpresents with preterm uterine contractions again.  Pt for VBAC. Cervical change from 3 to 4.  Contracting regularly but less now. BMZ was given several days ago.  GBS is NEG.  Because pt is for TOLAC, will observe for at least 24 hours.  If changes cervix or pain worsens, epidural and expect delivery.

## 2018-08-21 NOTE — Anesthesia Preprocedure Evaluation (Signed)
Anesthesia Evaluation  Patient identified by MRN, date of birth, ID band Patient awake    Reviewed: Allergy & Precautions, NPO status , Patient's Chart, lab work & pertinent test results  Airway Mallampati: II  TM Distance: >3 FB Neck ROM: Full    Dental no notable dental hx. (+) Teeth Intact, Dental Advisory Given   Pulmonary neg pulmonary ROS,    Pulmonary exam normal breath sounds clear to auscultation       Cardiovascular negative cardio ROS Normal cardiovascular exam Rhythm:Regular Rate:Normal     Neuro/Psych  Headaches, Seizures -, Well Controlled,  negative psych ROS   GI/Hepatic Neg liver ROS, GERD  ,  Endo/Other    Renal/GU Renal diseasenegative Renal ROS     Musculoskeletal negative musculoskeletal ROS (+)   Abdominal (+) + obese,   Peds  Hematology  (+) Blood dyscrasia, anemia ,   Anesthesia Other Findings Pt attempted VBAC  Reproductive/Obstetrics (+) Pregnancy                             Lab Results  Component Value Date   WBC 8.8 08/21/2018   HGB 11.5 (L) 08/21/2018   HCT 36.8 08/21/2018   MCV 93.9 08/21/2018   PLT 210 08/21/2018    Anesthesia Physical Anesthesia Plan  ASA: III  Anesthesia Plan: Epidural   Post-op Pain Management:    Induction:   PONV Risk Score and Plan:   Airway Management Planned:   Additional Equipment:   Intra-op Plan:   Post-operative Plan:   Informed Consent: I have reviewed the patients History and Physical, chart, labs and discussed the procedure including the risks, benefits and alternatives for the proposed anesthesia with the patient or authorized representative who has indicated his/her understanding and acceptance.     Plan Discussed with:   Anesthesia Plan Comments:         Anesthesia Quick Evaluation

## 2018-08-21 NOTE — Anesthesia Procedure Notes (Addendum)
Epidural Patient location during procedure: OB Start time: 08/21/2018 9:46 AM End time: 08/21/2018 10:05 AM  Staffing Anesthesiologist: Trevor Iha, MD Performed: anesthesiologist   Preanesthetic Checklist Completed: patient identified, site marked, surgical consent, pre-op evaluation, timeout performed, IV checked, risks and benefits discussed and monitors and equipment checked  Epidural Patient position: sitting Prep: site prepped and draped and DuraPrep Patient monitoring: continuous pulse ox and blood pressure Approach: midline Location: L3-L4 Injection technique: LOR air  Needle:  Needle type: Tuohy  Needle gauge: 17 G Needle length: 9 cm and 9 Needle insertion depth: 5 cm cm Catheter type: closed end flexible Catheter size: 19 Gauge Catheter at skin depth: 10 cm Test dose: negative  Assessment Events: blood not aspirated, injection not painful, no injection resistance, negative IV test and no paresthesia  Additional Notes 1 attempt. Pt tolerated procedure well.

## 2018-08-22 ENCOUNTER — Encounter (HOSPITAL_COMMUNITY): Payer: Self-pay | Admitting: Neonatal-Perinatal Medicine

## 2018-08-22 LAB — CBC
HCT: 32.7 % — ABNORMAL LOW (ref 36.0–46.0)
HEMOGLOBIN: 10.3 g/dL — AB (ref 12.0–15.0)
MCH: 29.7 pg (ref 26.0–34.0)
MCHC: 31.5 g/dL (ref 30.0–36.0)
MCV: 94.2 fL (ref 80.0–100.0)
Platelets: 175 10*3/uL (ref 150–400)
RBC: 3.47 MIL/uL — AB (ref 3.87–5.11)
RDW: 18.2 % — ABNORMAL HIGH (ref 11.5–15.5)
WBC: 15.6 10*3/uL — ABNORMAL HIGH (ref 4.0–10.5)
nRBC: 0.5 % — ABNORMAL HIGH (ref 0.0–0.2)

## 2018-08-22 MED ORDER — OXYTOCIN 40 UNITS IN LACTATED RINGERS INFUSION - SIMPLE MED: 2.5000 [IU]/h | INTRAVENOUS | Status: AC

## 2018-08-22 MED ORDER — SIMETHICONE 80 MG PO CHEW
80.0000 mg | CHEWABLE_TABLET | ORAL | Status: DC
  Administered 2018-08-22 – 2018-08-23 (×2): 80 mg via ORAL
  Filled 2018-08-22 (×2): qty 1

## 2018-08-22 MED ORDER — FERROUS SULFATE 325 (65 FE) MG PO TABS
325.0000 mg | ORAL_TABLET | Freq: Two times a day (BID) | ORAL | Status: DC
  Administered 2018-08-22 – 2018-08-24 (×5): 325 mg via ORAL
  Filled 2018-08-22 (×7): qty 1

## 2018-08-22 MED ORDER — ONDANSETRON HCL 4 MG/2ML IJ SOLN
INTRAMUSCULAR | Status: AC
  Filled 2018-08-22: qty 2

## 2018-08-22 MED ORDER — SENNOSIDES-DOCUSATE SODIUM 8.6-50 MG PO TABS
2.0000 | ORAL_TABLET | ORAL | Status: DC
  Filled 2018-08-22: qty 2

## 2018-08-22 MED ORDER — AZATHIOPRINE 50 MG PO TABS
100.0000 mg | ORAL_TABLET | Freq: Every day | ORAL | Status: DC
  Administered 2018-08-22 – 2018-08-23 (×2): 100 mg via ORAL
  Filled 2018-08-22 (×2): qty 2

## 2018-08-22 MED ORDER — DIPHENHYDRAMINE HCL 25 MG PO CAPS
25.0000 mg | ORAL_CAPSULE | Freq: Four times a day (QID) | ORAL | Status: DC | PRN
  Filled 2018-08-22: qty 1

## 2018-08-22 MED ORDER — SENNOSIDES-DOCUSATE SODIUM 8.6-50 MG PO TABS
2.0000 | ORAL_TABLET | ORAL | Status: DC
  Administered 2018-08-22 – 2018-08-23 (×2): 2 via ORAL
  Filled 2018-08-22 (×2): qty 2

## 2018-08-22 MED ORDER — METHYLERGONOVINE MALEATE 0.2 MG/ML IJ SOLN: 0.2000 mg | INTRAMUSCULAR | Status: DC | PRN

## 2018-08-22 MED ORDER — NALBUPHINE HCL 10 MG/ML IJ SOLN: 5.0000 mg | INTRAMUSCULAR | Status: DC | PRN

## 2018-08-22 MED ORDER — OXYCODONE-ACETAMINOPHEN 5-325 MG PO TABS: 2.0000 | ORAL_TABLET | ORAL | Status: DC | PRN

## 2018-08-22 MED ORDER — SERTRALINE HCL 100 MG PO TABS
100.0000 mg | ORAL_TABLET | Freq: Every day | ORAL | Status: DC
  Administered 2018-08-22 – 2018-08-23 (×2): 100 mg via ORAL
  Filled 2018-08-22 (×2): qty 1

## 2018-08-22 MED ORDER — SIMETHICONE 80 MG PO CHEW
80.0000 mg | CHEWABLE_TABLET | ORAL | Status: DC | PRN
  Filled 2018-08-22: qty 1

## 2018-08-22 MED ORDER — ONDANSETRON HCL 4 MG/2ML IJ SOLN: 4.0000 mg | Freq: Three times a day (TID) | INTRAMUSCULAR | Status: DC | PRN

## 2018-08-22 MED ORDER — CEFAZOLIN SODIUM-DEXTROSE 2-4 GM/100ML-% IV SOLN
INTRAVENOUS | Status: AC
  Filled 2018-08-22: qty 100

## 2018-08-22 MED ORDER — NALOXONE HCL 0.4 MG/ML IJ SOLN: 0.4000 mg | INTRAMUSCULAR | Status: DC | PRN

## 2018-08-22 MED ORDER — SCOPOLAMINE 1 MG/3DAYS TD PT72
1.0000 | MEDICATED_PATCH | Freq: Once | TRANSDERMAL | Status: DC
  Filled 2018-08-22: qty 1

## 2018-08-22 MED ORDER — ZOLPIDEM TARTRATE 5 MG PO TABS: 5.0000 mg | ORAL_TABLET | Freq: Every evening | ORAL | Status: DC | PRN

## 2018-08-22 MED ORDER — ACETAMINOPHEN 325 MG PO TABS
650.0000 mg | ORAL_TABLET | ORAL | Status: DC | PRN
  Administered 2018-08-22 – 2018-08-24 (×3): 650 mg via ORAL
  Filled 2018-08-22 (×4): qty 2

## 2018-08-22 MED ORDER — SODIUM CHLORIDE 0.9 % IR SOLN
Status: DC | PRN
  Administered 2018-08-22: 1000 mL

## 2018-08-22 MED ORDER — LORATADINE 10 MG PO TABS
10.0000 mg | ORAL_TABLET | Freq: Every day | ORAL | Status: DC
  Administered 2018-08-22 – 2018-08-23 (×2): 10 mg via ORAL
  Filled 2018-08-22 (×2): qty 1

## 2018-08-22 MED ORDER — MENTHOL 3 MG MT LOZG
1.0000 | LOZENGE | OROMUCOSAL | Status: DC | PRN
  Filled 2018-08-22: qty 9

## 2018-08-22 MED ORDER — WITCH HAZEL-GLYCERIN EX PADS: 1.0000 "application " | MEDICATED_PAD | CUTANEOUS | Status: DC | PRN

## 2018-08-22 MED ORDER — DEXAMETHASONE SODIUM PHOSPHATE 4 MG/ML IJ SOLN
INTRAMUSCULAR | Status: AC
  Filled 2018-08-22: qty 1

## 2018-08-22 MED ORDER — KETOROLAC TROMETHAMINE 30 MG/ML IJ SOLN: 30.0000 mg | Freq: Four times a day (QID) | INTRAMUSCULAR | Status: AC | PRN

## 2018-08-22 MED ORDER — SODIUM CHLORIDE 0.9% FLUSH: 3.0000 mL | INTRAVENOUS | Status: DC | PRN

## 2018-08-22 MED ORDER — COCONUT OIL OIL
1.0000 "application " | TOPICAL_OIL | Status: DC | PRN
  Filled 2018-08-22: qty 120

## 2018-08-22 MED ORDER — TETANUS-DIPHTH-ACELL PERTUSSIS 5-2.5-18.5 LF-MCG/0.5 IM SUSP: 0.5000 mL | Freq: Once | INTRAMUSCULAR | Status: DC

## 2018-08-22 MED ORDER — FLEET ENEMA 7-19 GM/118ML RE ENEM: 1.0000 | ENEMA | Freq: Every day | RECTAL | Status: DC | PRN

## 2018-08-22 MED ORDER — ONDANSETRON HCL 4 MG/2ML IJ SOLN: 4.0000 mg | Freq: Once | INTRAMUSCULAR | Status: DC | PRN

## 2018-08-22 MED ORDER — METHYLERGONOVINE MALEATE 0.2 MG PO TABS: 0.2000 mg | ORAL_TABLET | ORAL | Status: DC | PRN

## 2018-08-22 MED ORDER — HYDROCODONE-ACETAMINOPHEN 7.5-325 MG PO TABS: 1.0000 | ORAL_TABLET | Freq: Once | ORAL | Status: DC | PRN

## 2018-08-22 MED ORDER — OXYCODONE-ACETAMINOPHEN 5-325 MG PO TABS
1.0000 | ORAL_TABLET | ORAL | Status: DC | PRN
  Filled 2018-08-22: qty 1

## 2018-08-22 MED ORDER — URSODIOL 500 MG PO TABS
500.0000 mg | ORAL_TABLET | Freq: Two times a day (BID) | ORAL | Status: AC
  Administered 2018-08-22: 500 mg via ORAL

## 2018-08-22 MED ORDER — OXYTOCIN 10 UNIT/ML IJ SOLN
INTRAMUSCULAR | Status: AC
  Filled 2018-08-22: qty 4

## 2018-08-22 MED ORDER — NALOXONE HCL 4 MG/10ML IJ SOLN
1.0000 ug/kg/h | INTRAVENOUS | Status: DC | PRN
  Filled 2018-08-22: qty 5

## 2018-08-22 MED ORDER — DIPHENHYDRAMINE HCL 25 MG PO CAPS: 25.0000 mg | ORAL_CAPSULE | ORAL | Status: DC | PRN

## 2018-08-22 MED ORDER — ACETAMINOPHEN 10 MG/ML IV SOLN
1000.0000 mg | Freq: Once | INTRAVENOUS | Status: DC | PRN
  Administered 2018-08-22: 1000 mg via INTRAVENOUS

## 2018-08-22 MED ORDER — HYDROMORPHONE HCL 1 MG/ML IJ SOLN: 0.2500 mg | INTRAMUSCULAR | Status: DC | PRN

## 2018-08-22 MED ORDER — LACTATED RINGERS IV SOLN
INTRAVENOUS | Status: DC
  Administered 2018-08-22: 08:00:00 via INTRAVENOUS

## 2018-08-22 MED ORDER — NALBUPHINE HCL 10 MG/ML IJ SOLN: 5.0000 mg | Freq: Once | INTRAMUSCULAR | Status: DC | PRN

## 2018-08-22 MED ORDER — DIPHENHYDRAMINE HCL 50 MG/ML IJ SOLN: 12.5000 mg | INTRAMUSCULAR | Status: DC | PRN

## 2018-08-22 MED ORDER — MONTELUKAST SODIUM 10 MG PO TABS
10.0000 mg | ORAL_TABLET | Freq: Every day | ORAL | Status: DC
  Administered 2018-08-22 – 2018-08-23 (×2): 10 mg via ORAL
  Filled 2018-08-22 (×2): qty 1

## 2018-08-22 MED ORDER — ACETAMINOPHEN 10 MG/ML IV SOLN
INTRAVENOUS | Status: AC
  Filled 2018-08-22: qty 100

## 2018-08-22 MED ORDER — SIMETHICONE 80 MG PO CHEW
80.0000 mg | CHEWABLE_TABLET | Freq: Three times a day (TID) | ORAL | Status: DC
  Administered 2018-08-22 – 2018-08-24 (×7): 80 mg via ORAL
  Filled 2018-08-22 (×10): qty 1

## 2018-08-22 MED ORDER — LAMOTRIGINE ER 200 MG PO TB24
600.0000 mg | ORAL_TABLET | Freq: Every day | ORAL | Status: DC
  Administered 2018-08-22 – 2018-08-23 (×2): 600 mg via ORAL
  Filled 2018-08-22: qty 3

## 2018-08-22 MED ORDER — SCOPOLAMINE 1 MG/3DAYS TD PT72
MEDICATED_PATCH | TRANSDERMAL | Status: AC
  Filled 2018-08-22: qty 1

## 2018-08-22 MED ORDER — MEASLES, MUMPS & RUBELLA VAC IJ SOLR
0.5000 mL | Freq: Once | INTRAMUSCULAR | Status: DC
  Filled 2018-08-22: qty 0.5

## 2018-08-22 MED ORDER — BISACODYL 10 MG RE SUPP
10.0000 mg | Freq: Every day | RECTAL | Status: DC | PRN
  Filled 2018-08-22: qty 1

## 2018-08-22 MED ORDER — MEPERIDINE HCL 25 MG/ML IJ SOLN: 6.2500 mg | INTRAMUSCULAR | Status: DC | PRN

## 2018-08-22 MED ORDER — DIBUCAINE 1 % RE OINT
1.0000 "application " | TOPICAL_OINTMENT | RECTAL | Status: DC | PRN
  Filled 2018-08-22: qty 28

## 2018-08-22 MED ORDER — PRENATAL MULTIVITAMIN CH
1.0000 | ORAL_TABLET | Freq: Every day | ORAL | Status: DC
  Administered 2018-08-22 – 2018-08-23 (×2): 1 via ORAL
  Filled 2018-08-22 (×3): qty 1

## 2018-08-22 MED ORDER — SIMETHICONE 80 MG PO CHEW
80.0000 mg | CHEWABLE_TABLET | ORAL | Status: DC
  Filled 2018-08-22: qty 1

## 2018-08-22 MED ORDER — IBUPROFEN 800 MG PO TABS
800.0000 mg | ORAL_TABLET | Freq: Three times a day (TID) | ORAL | Status: DC
  Administered 2018-08-22 – 2018-08-24 (×7): 800 mg via ORAL
  Filled 2018-08-22 (×8): qty 1

## 2018-08-22 NOTE — Addendum Note (Signed)
Addendum  created 08/22/18 0743 by Prescott Truex, CRNA   Sign clinical note    

## 2018-08-22 NOTE — Brief Op Note (Signed)
08/21/2018  12:36 AM  PATIENT:  Desiree Marshall  37 y.o. female  PRE-OPERATIVE DIAGNOSIS:  Failed TOLAC: non-reassuring fetal heart tracing, failure of descent  POST-OPERATIVE DIAGNOSIS:  Failed TOLAC: non-reassuring fetal heart tracing, failure of descent, preterm labor  PROCEDURE:  Procedure(s): CESAREAN SECTION (N/A)  SURGEON:  Surgeon(s) and Role:    * Bobbye Charleston, MD - Primary  PHYSICIAN ASSISTANT:   ASSISTANTS: none   ANESTHESIA:   epidural  EBL:  957 mL   SPECIMEN:  Source of Specimen:  placenta  DISPOSITION OF SPECIMEN:  PATHOLOGY  COUNTS:  YES  TOURNIQUET:  * No tourniquets in log *  DICTATION: .Note written in EPIC  PLAN OF CARE: Admit to inpatient   PATIENT DISPOSITION:  PACU - hemodynamically stable.   Delay start of Pharmacological VTE agent (>24hrs) due to surgical blood loss or risk of bleeding: not applicable

## 2018-08-22 NOTE — Progress Notes (Signed)
  Patient is eating, ambulating, voiding.  Pain control is good.  Vitals:   08/22/18 0221 08/22/18 0327 08/22/18 0420 08/22/18 0520  BP: 121/74 117/76 111/72 115/77  Pulse: 85 79 89 85  Resp: 20     Temp: 99.3 F (37.4 C) 98.5 F (36.9 C) 99.3 F (37.4 C) 99 F (37.2 C)  TempSrc: Oral Oral Oral Oral  SpO2: 93% 93% 93%   Weight:      Height:        lungs:   clear to auscultation cor:    RRR Abdomen:  soft, appropriate tenderness, incisions intact and without erythema or exudate ex:    no cords   Lab Results  Component Value Date   WBC 15.6 (H) 08/22/2018   HGB 10.3 (L) 08/22/2018   HCT 32.7 (L) 08/22/2018   MCV 94.2 08/22/2018   PLT 175 08/22/2018    --/--/A NEG (11/23 0404)/RI  A/P    Post operative day 1.  Routine post op and postpartum care.  Expect d/c routine.  Percocet for pain control. Bsby is O neg- no Rhogam.

## 2018-08-22 NOTE — Transfer of Care (Signed)
Immediate Anesthesia Transfer of Care Note  Patient: Desiree Marshall  Procedure(s) Performed: CESAREAN SECTION (N/A )  Patient Location: PACU  Anesthesia Type:Epidural  Level of Consciousness: awake, alert  and oriented  Airway & Oxygen Therapy: Patient Spontanous Breathing  Post-op Assessment: Report given to RN and Post -op Vital signs reviewed and stable  Post vital signs: Reviewed and stable  Last Vitals:  Vitals Value Taken Time  BP    Temp    Pulse 93 08/22/2018 12:42 AM  Resp    SpO2 98 % 08/22/2018 12:42 AM  Vitals shown include unvalidated device data.  Last Pain:  Vitals:   08/21/18 2250  TempSrc: Oral  PainSc:          Complications: No apparent anesthesia complications

## 2018-08-22 NOTE — Anesthesia Postprocedure Evaluation (Signed)
Anesthesia Post Note  Patient: Desiree Marshall  Procedure(s) Performed: CESAREAN SECTION (N/A )     Patient location during evaluation: Mother Baby Anesthesia Type: Epidural Level of consciousness: awake and alert Pain management: pain level controlled Vital Signs Assessment: post-procedure vital signs reviewed and stable Respiratory status: spontaneous breathing, nonlabored ventilation and respiratory function stable Cardiovascular status: stable Postop Assessment: no headache, no backache and epidural receding Anesthetic complications: no    Last Vitals:  Vitals:   08/22/18 0420 08/22/18 0520  BP: 111/72 115/77  Pulse: 89 85  Resp:    Temp: 37.4 C 37.2 C  SpO2: 93%     Last Pain:  Vitals:   08/22/18 0520  TempSrc: Oral  PainSc: 2    Pain Goal:                 Junious Silk

## 2018-08-22 NOTE — Anesthesia Postprocedure Evaluation (Signed)
Anesthesia Post Note  Patient: Desiree Marshall  Procedure(s) Performed: CESAREAN SECTION (N/A )     Patient location during evaluation: Mother Baby Anesthesia Type: Epidural Level of consciousness: awake and alert Pain management: pain level controlled Vital Signs Assessment: post-procedure vital signs reviewed and stable Respiratory status: spontaneous breathing, nonlabored ventilation, respiratory function stable and patient connected to nasal cannula oxygen Cardiovascular status: stable and blood pressure returned to baseline Postop Assessment: no apparent nausea or vomiting Anesthetic complications: no    Last Vitals:  Vitals:   08/22/18 0221 08/22/18 0327  BP: 121/74 117/76  Pulse: 85 79  Resp: 20   Temp: 37.4 C 36.9 C  SpO2: 93% 93%    Last Pain:  Vitals:   08/22/18 0327  TempSrc: Oral  PainSc: 3    Pain Goal:                 Barnet Glasgow

## 2018-08-22 NOTE — Progress Notes (Signed)
Husband called RN to evaluate patient because "she wasn't acting her normal self." Patient seemed groggy and had difficulty verbalizing what she wanted to say. Scope patch was removed. Patient improved over the next hour. Patient says she feels like she "lost her filter and says anything on her mind". Patient is oriented x4 and is able to answer questions. MD called around [phone] and was at bedside at 1000. MD updated. Vital signs stable. Will continue to monitor.

## 2018-08-22 NOTE — Op Note (Signed)
08/21/2018  12:36 AM  PATIENT:  Desiree Marshall  37 y.o. female  PRE-OPERATIVE DIAGNOSIS:  Failed TOLAC: non-reassuring fetal heart tracing, failure of descent  POST-OPERATIVE DIAGNOSIS:  Failed TOLAC: non-reassuring fetal heart tracing, failure of descent, preterm labor  PROCEDURE:  Procedure(s): CESAREAN SECTION (N/A)  SURGEON:  Surgeon(s) and Role:    * Bobbye Charleston, MD - Primary  PHYSICIAN ASSISTANT:   ASSISTANTS: none   ANESTHESIA:   epidural  EBL:  957 mL   SPECIMEN:  Source of Specimen:  placenta  DISPOSITION OF SPECIMEN:  PATHOLOGY  COUNTS:  YES  TOURNIQUET:  * No tourniquets in log *  DICTATION: .Note written in EPIC  PLAN OF CARE: Admit to inpatient   PATIENT DISPOSITION:  PACU - hemodynamically stable.   Delay start of Pharmacological VTE agent (>24hrs) due to surgical blood loss or risk of bleeding: not applicable Complications:  none Medications:  Ancef, Pitocin Findings:  Baby female, Apgars 6,9, weight P.   Normal tubes, ovaries and uterus seen.  Baby was skin to skin with mother after birth in the OR.  Technique:  After adequate epidural anesthesia was achieved, the patient was prepped and draped in usual sterile fashion.  A foley catheter was used to drain the bladder.  A pfannanstiel incision was made with the scalpel and carried down to the fascia with the bovie cautery. The fascia was incised in the midline with the scalpel and carried in a transverse curvilinear manner bilaterally.  The fascia was reflected superiorly and inferiorly off the rectus muscles and the muscles split in the midline.  A bowel free portion of the peritoneum was entered bluntly and then extended in a superior and inferior manner with good visualization of the bowel and bladder.  The Alexis instrument was then placed and the vesico-uterine fascia tented up and incised in a transverse curvilinear manner.  A 2 cm transverse incision was made in the upper portion of the lower  uterine segment until the amnion was exposed.   The incision was extended transversely in a blunt manner.  Clear fluid was noted and the baby delivered in the vertex presentation without complication.  The baby was bulb suctioned and the cord was clamped and cut aftet stripping blood from cord into baby.  The baby was then handed to awaiting Neonatology.  The placenta was then delivered manually and the uterus cleared of all debris.  The uterine incision was then closed with a running lock stitch of 0 monocryl.  An imbricating layer of 0 monocryl was closed as well. Several extra figure of 8 stitches were done for hemostasis and a small pumper was finally located and ligated with 0 monocryl. Excellent hemostasis of the uterine incision was achieved and the abdomen was cleared with irrigation.  The peritoneum was closed with a running stitch of 2-0 vicryl.  This incorporated the rectus muscles as a separate layer.  The fascia was then closed with a running stitch of 0 vicryl.  The subcutaneous layer was closed with interrupted  stitches of 2-0 plain gut.  The skin was closed with 4-0 vicryl on a Keith needle and steri-strips.  The patient tolerated the procedure well and was returned to the recovery room in stable condition.  All counts were correct times three.  Jaylan Duggar A

## 2018-08-23 ENCOUNTER — Encounter (HOSPITAL_COMMUNITY): Payer: Self-pay | Admitting: *Deleted

## 2018-08-23 MED ORDER — OXYCODONE HCL 5 MG PO TABS: 5.0000 mg | ORAL_TABLET | ORAL | Status: DC | PRN

## 2018-08-23 NOTE — Progress Notes (Signed)
Patient is doing well.  She is tolerating PO, ambulating, voiding.  Pain is controlled.  Lochia is appropriate.  Patient is worried about postpartum depression and anxiety--she struggled after last delivery.  Is currently on zoloft 173m daily and feeling well  Vitals:   08/22/18 1344 08/22/18 1708 08/22/18 2200 08/23/18 0500  BP: 121/81 112/72 117/72 124/84  Pulse: 80 79 76 79  Resp: 20 18 18    Temp: 97.6 F (36.4 C) 97.6 F (36.4 C) 97.6 F (36.4 C) 97.8 F (36.6 C)  TempSrc: Oral Oral Oral Oral  SpO2: 97% 96% 95%   Weight:      Height:        NAD Abdomen:  soft, appropriate tenderness, incisions intact and without erythema or drainage ext:    Symmetric, 1+ edema bilaterally  Lab Results  Component Value Date   WBC 15.6 (H) 08/22/2018   HGB 10.3 (L) 08/22/2018   HCT 32.7 (L) 08/22/2018   MCV 94.2 08/22/2018   PLT 175 08/22/2018    --/--/A NEG (11/23 0404)/RImmune  A/P    37y.o. [id number] 2 s/p RCS after failed TOLAC Routine post op and postpartum care.   Rh neg--baby Rh neg, no rhogam needed History of depression / anxiety, worsened after second delivery.  Continue zoloft 1050mdaily.  Patient desires short interval f/u in office at 2 wks postpartum Late preterm infant--will need at least 48-72 hr obs.  Discharge tomorrow for mom at earliest

## 2018-08-24 MED ORDER — IBUPROFEN 800 MG PO TABS: 800.0000 mg | ORAL_TABLET | Freq: Three times a day (TID) | ORAL | 0 refills | Status: AC

## 2018-08-24 NOTE — Progress Notes (Signed)
POD#3 Pt states that she is doing well. She is ready to go home.  VSSAF IMP/ Stable Plan/ Discharge to home

## 2018-08-24 NOTE — Discharge Summary (Signed)
Obstetric Discharge Summary Reason for Admission: onset of labor Prenatal Procedures: NST and ultrasound Intrapartum Procedures: cesarean: low cervical, transverse and failed TOLAC Postpartum Procedures: none Complications-Operative and Postpartum: none Hemoglobin  Date Value Ref Range Status  08/22/2018 10.3 (L) 12.0 - 15.0 g/dL Final   HCT  Date Value Ref Range Status  08/22/2018 32.7 (L) 36.0 - 46.0 % Final    Physical Exam:  General: alert and cooperative Lochia: appropriate  Discharge Diagnoses: Term Pregnancy-delivered  Discharge Information: Date: 08/24/2018 Activity: pelvic rest Diet: routine Medications: PNV and Ibuprofen Condition: stable Instructions: refer to practice specific booklet Discharge to: home Follow-up Information    Bobbye Charleston, MD Follow up in 2 week(s).   Specialty:  Obstetrics and Gynecology Why:  Mood check--h/o PPD and anxiety Contact information: [address] [place] [address] [phone]        Jerelyn Charles, MD. Schedule an appointment as soon as possible for a visit in 2 week(s).   Specialty:  Obstetrics Contact information: [place] [place] [address] [phone]           Newborn Data: Live born female  Birth Weight: 6 lb 14.1 oz (3120 g) APGAR: 6, 9  Newborn Delivery   Birth date/time:  08/21/2018 23:45:00 Delivery type:  C-Section, Low Transverse Trial of labor:  Yes C-section categorization:  Repeat     Home with mother.  ANDERSON,MARK E 08/24/2018, 8:30 AM

## 2018-08-25 ENCOUNTER — Telehealth: Payer: Self-pay

## 2018-08-25 DIAGNOSIS — G40209 Localization-related (focal) (partial) symptomatic epilepsy and epileptic syndromes with complex partial seizures, not intractable, without status epilepticus: Secondary | ICD-10-CM

## 2018-08-25 DIAGNOSIS — R569 Unspecified convulsions: Secondary | ICD-10-CM

## 2018-08-25 NOTE — Telephone Encounter (Signed)
ERROR

## 2018-08-25 NOTE — Telephone Encounter (Signed)
Lab orders placed.  

## 2018-08-30 ENCOUNTER — Telehealth: Payer: Self-pay | Admitting: Neurology

## 2018-08-30 NOTE — Telephone Encounter (Signed)
Patient had her baby on 08-21-18 and she wanted to know if she needed to have blood work done now that she has had the baby. Please call

## 2018-08-30 NOTE — Telephone Encounter (Signed)
Spoke with pt.  Advised to reduce Lamictal 222m to (2) tabs daily for (4) days then to (1) tab daily.  Repeat levels (7) days after lowering to (1) tab daily.  Pt verbalized understanding.

## 2018-09-15 LAB — LAMOTRIGINE LEVEL: Lamotrigine Lvl: 4 ug/mL (ref 4.0–18.0)

## 2018-09-16 ENCOUNTER — Telehealth: Payer: Self-pay

## 2018-09-16 DIAGNOSIS — G40209 Localization-related (focal) (partial) symptomatic epilepsy and epileptic syndromes with complex partial seizures, not intractable, without status epilepticus: Secondary | ICD-10-CM

## 2018-09-16 DIAGNOSIS — G40309 Generalized idiopathic epilepsy and epileptic syndromes, not intractable, without status epilepticus: Secondary | ICD-10-CM

## 2018-09-16 DIAGNOSIS — R569 Unspecified convulsions: Secondary | ICD-10-CM

## 2018-09-16 NOTE — Telephone Encounter (Signed)
-----   Message from Cameron Sprang, MD sent at 09/16/2018  9:26 AM EST ----- Pls let her know the Lamictal level is 4.0, her baseline is 5. As long as she is feeling fine, we continue on her original pre-pregnancy dose of Lamictal. Thanks

## 2018-09-16 NOTE — Addendum Note (Signed)
Addended by: Horatio Pel on: 09/16/2018 03:20 PM   Modules accepted: Orders

## 2018-09-16 NOTE — Telephone Encounter (Signed)
Pt called back stating that she is hesitant to stay on 200mg  QHS with her level being 4 instead of 5.  Asks if it is harmful for her to stay at 400mg  QHS.  States that she does not want to take a chance of having seizure while taking care of her 3 children - including newborn. I advised that that should not be an issue, but I would like to re-check level sometime between December 26 and Jan 1.  Order placed, lab slip printed and placed with outgoing mail.

## 2018-09-16 NOTE — Telephone Encounter (Signed)
Spoke with pt relaying message below.  She states that she feels alright.  I advised that if she gets aura/has seizure to call us, as we will repeat labs.  Pt verbalized understanding.

## 2018-09-24 ENCOUNTER — Telehealth: Payer: Self-pay | Admitting: Neurology

## 2018-09-24 NOTE — Telephone Encounter (Signed)
Spoke with pt.  She states that she still has not received lab order via [employer] yet.  States that she had headache yesterday and took an extra lamotrigine then felt OK.  Pt states that she can come to office on Monday foro blood draw.  Advised to continue increase dose until we have her levels.  Pt verbalized understanding.

## 2018-09-24 NOTE — Telephone Encounter (Signed)
Patient was told to call and follow up with medication change. She wanted to speak with the nurse. Please call her back at [phone]. Thanks!

## 2018-09-27 ENCOUNTER — Other Ambulatory Visit: Payer: 59

## 2018-09-30 ENCOUNTER — Telehealth: Payer: Self-pay

## 2018-09-30 LAB — LAMOTRIGINE LEVEL: Lamotrigine Lvl: 7.1 ug/mL (ref 4.0–18.0)

## 2018-09-30 NOTE — Telephone Encounter (Signed)
-----   Message from Cameron Sprang, MD sent at 09/30/2018 11:40 AM EST ----- Pls let her know the Lamictal level is 7.1, this is within range and if she would like to continue on current dose of 456m qhs, that is fine, as long as no side effects. Thanks

## 2018-09-30 NOTE — Telephone Encounter (Signed)
Spoke with pt relaying message below.  She states that she feels more comfortable on the increased dose and will continue.  Advised to call our office if she feels like she needs another level check.

## 2018-11-17 ENCOUNTER — Other Ambulatory Visit: Payer: Self-pay

## 2018-11-17 ENCOUNTER — Ambulatory Visit (INDEPENDENT_AMBULATORY_CARE_PROVIDER_SITE_OTHER): Payer: 59 | Admitting: Neurology

## 2018-11-17 ENCOUNTER — Encounter: Payer: Self-pay | Admitting: Neurology

## 2018-11-17 VITALS — BP 114/68 | HR 62 | Ht 59.0 in | Wt 150.0 lb

## 2018-11-17 DIAGNOSIS — G40009 Localization-related (focal) (partial) idiopathic epilepsy and epileptic syndromes with seizures of localized onset, not intractable, without status epilepticus: Secondary | ICD-10-CM

## 2018-11-17 MED ORDER — LAMOTRIGINE ER 200 MG PO TB24: ORAL_TABLET | ORAL | 3 refills | Status: DC

## 2018-11-17 NOTE — Patient Instructions (Signed)
Congratulations again! Continue Lamictal XR 200mg  2 tablets daily. Continue Imitrex as needed, do not take more than 2-3 a week. Follow-up in 6-8 months, call for any changes.  Seizure Precautions: 1. If medication has been prescribed for you to prevent seizures, take it exactly as directed.  Do not stop taking the medicine without talking to your doctor first, even if you have not had a seizure in a long time.   2. Avoid activities in which a seizure would cause danger to yourself or to others.  Don't operate dangerous machinery, swim alone, or climb in high or dangerous places, such as on ladders, roofs, or girders.  Do not drive unless your doctor says you may.  3. If you have any warning that you may have a seizure, lay down in a safe place where you can't hurt yourself.    4.  No driving for 6 months from last seizure, as per [facility].   Please refer to the following link on the [employer]ile[employer] of [employer]'s website for more information: http://www.epilepsyfoundation.org/answerplace/Social/driving/drivingu.cfm   5.  Maintain good sleep hygiene. Avoid alcohol.  6.  Notify your neurology if you are planning pregnancy or if you become pregnant.  7.  Contact your doctor if you have any problems that may be related to the medicine you are taking.  8.  Call 911 and bring the patient back to the ED if:        A.  The seizure lasts longer than 5 minutes.       B.  The patient doesn't awaken shortly after the seizure  C.  The patient has new problems such as difficulty seeing, speaking or moving  D.  The patient was injured during the seizure  E.  The patient has a temperature over 102 F (39C)  F.  The patient vomited and now is having trouble breathing

## 2018-11-17 NOTE — Progress Notes (Signed)
NEUROLOGY FOLLOW UP OFFICE NOTE  Desiree Marshall [id number] 08/25/1981  HISTORY OF PRESENT ILLNESS: I had the pleasure of seeing Desiree Marshall in follow-up in the neurology clinic on 11/17/2018.  The patient was last seen 7 months ago for seizures and migraines. She delivered her healthy baby boy last November 2019, Lamictal dose was increased during pregnancy to maintain pre-pregnancy baseline levels. After delivery, dose was reduced back to pre-pregnancy dose of 200mg  qhs. Lamictal level was 4 (baseline was 5). She opted to increase dose to 400mg  qhs since she did not want to take any risks with her newborn. Lamictal level 7.1, no side effects. She is not breastfeeding.She denies any seizures since 2009. She denies any staring/unresponsive episodes, speech difficulties, gaps in time, olfactory/gustatory hallucinations, myoclonic jerks. Her cough headaches completely disappeared during pregnancy but have come back sporadically since then. She was also migraine-free during pregnancy, but has had a couple with her menses since then, with good response to Imitrex. She denies any focal numbness/tingling/weakness, vision changes, no falls.   HPI 11/03/2017: This is a pleasant 38 yo RH woman with a history of Crohn's disease, migraines, and seizures. She had been seeing neurologist Dr. Yan, records were reviewed. She started having bad migraines in high school, usually with left frontal pressure. She used to have nausea/vomiting, none since having Imitrex available. No photo/phonophobia. They usually occur with her menstrual period, she usually gets 3-4 migraines a month during the time of her menses. No side effects on Imitrex. She is concerned about a different type of pain this past year, when she laughs, sneezes, or bears down, she would have a brief diffuse sharp pain in her head like she is dizzy, then goes away after a second. There is no associated confusion, focal numbness/tingling/weakness,  nausea/vomiting, visual obscurations. Poor sleep is a trigger for her migraines. No family history of migraines. She denies any tinnitus, diplopia, dysarthria/dysphagia, neck pain. She has occasional dizziness upon standing a couple of times a month lasting a few minutes. She usually gets 6-7 hours of sleep. No history of head injuries.   Seizures started in her teens, she would have speech difficulties where she thinks a word but can't say it. She would feel "foggy brain," then have a generalized convulsion. She has had only 3 seizures in her lifetime, the second occurred when she was tried to wean off Tegretol, and the last seizure occurred in 2009 when she missed a dose of Lamotrigine. She is taking Lamotrigine XR 200mg  daily without side effects.  She denies any olfactory/gustatory hallucinations, deja vu, rising epigastric sensation, focal numbness/tingling/weakness, myoclonic jerks.  Epilepsy Risk Factors:  She had a normal birth and early development.  There is no history of febrile convulsions, CNS infections such as meningitis/encephalitis, significant traumatic brain injury, neurosurgical procedures, or family history of seizures.  Diagnostic Data: EEG in 04/2016 was normal. 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 Lamictal level 11/2016: 5.0  PAST MEDICAL HISTORY: Past Medical History:  Diagnosis Date  . Anemia    Resolved  . Astigmatism   . Chronic kidney disease   . Crohn's disease (HCC)   . Eczema   . Epilepsy (HCC)    Diagnosed at the age of 15.   . Gallstone   . Kidney stone   . Migraine   . Seizures (HCC)  MEDICATIONS: Current Outpatient Medications on File Prior to Visit  Medication Sig Dispense Refill  . busPIRone (BUSPAR) 5 MG tablet 2 (two) times daily.    Marland Kitchen ibuprofen  (ADVIL,MOTRIN) 800 MG tablet Take 1 tablet (800 mg total) by mouth 3 (three) times daily. 30 tablet 0  . InFLIXimab (REMICADE IV) Inject 1 each into the vein every 8 (eight) weeks.     . LamoTRIgine 200 MG TB24 24 hour tablet TAKE 3 TABLETS AT BEDTIME (Patient taking differently: TAKE 2 TABLETS AT BEDTIME) 270 tablet 3  . sertraline (ZOLOFT) 100 MG tablet Take 100 mg by mouth at bedtime.     No current facility-administered medications on file prior to visit.     ALLERGIES: Allergies  Allergen Reactions  . Promethazine Hcl Other (See Comments)    Pt states that this medication causes hallucinations.     FAMILY HISTORY: Family History  Problem Relation Age of Onset  . Asthma Mother   . Hypertension Mother   . Allergic rhinitis Mother   . Cancer Father 38       Prostate Cancer  . Heart disease Maternal Grandmother   . Heart disease Maternal Grandfather   . Stroke Paternal Grandmother   . Heart disease Paternal Grandfather   . Allergic rhinitis Brother   . Angioedema Neg Hx   . Atopy Neg Hx   . Eczema Neg Hx   . Immunodeficiency Neg Hx   . Urticaria Neg Hx     SOCIAL HISTORY: Social History   Socioeconomic History  . Marital status: Married    Spouse name: Daron Offer   . Number of children: 1  . Years of education: Masters  . Highest education level: Not on file  Occupational History  . Occupation: HOMEMAKER   Social Needs  . Financial resource strain: Not hard at all  . Food insecurity:    Worry: Patient refused    Inability: Patient refused  . Transportation needs:    Medical: Patient refused    Non-medical: Patient refused  Tobacco Use  . Smoking status: Never Smoker  . Smokeless tobacco: Never Used  Substance and Sexual Activity  . Alcohol use: Not Currently  . Drug use: Never  . Sexual activity: Yes    Partners: Male    Birth control/protection: None  Lifestyle  . Physical activity:    Days per week: Not on file    Minutes per session: Not on file  .  Stress: Not on file  Relationships  . Social connections:    Talks on phone: Not on file    Gets together: Not on file    Attends religious service: Not on file    Active member of club or organization: Not on file    Attends meetings of clubs or organizations: Not on file    Relationship status: Not on file  . Intimate partner violence:    Fear of current or ex partner: Not on file    Emotionally abused: Not on file    Physically abused: Not on file    Forced sexual activity: Not on file  Other Topics Concern  . Not on file  Social History Narrative   ** Merged History Encounter **       Marital Status:  Married Estate agent)  Children:  2 Sons (Clifford and [place])  Pets: Dog (1)  Living Situation: Lives with husband and son Occupation: Futures trader  Education: Manufacturing engineer in Nursing Administration     Tobacco Use/Exposure:  None  Alc   ohol Use:  Occasional Drug Use:  None Diet:  Regular Exercise:  Walking - once weekly  Hobbies: Baking - Sewing  Right-handed. 2 cups caffeine daily.        REVIEW OF SYSTEMS: Constitutional: No fevers, chills, or sweats, no generalized fatigue, change in appetite Eyes: No visual changes, double vision, eye pain Ear, nose and throat: No hearing loss, ear pain, nasal congestion, sore throat Cardiovascular: No chest pain, palpitations Respiratory:  No shortness of breath at rest or with exertion, wheezes GastrointestinaI: No nausea, vomiting, diarrhea, abdominal pain, fecal incontinence Genitourinary:  No dysuria, urinary retention or frequency Musculoskeletal:  No neck pain, back pain Integumentary: No rash, pruritus, skin lesions Neurological: as above Psychiatric: No depression, insomnia, anxiety Endocrine: No palpitations, fatigue, diaphoresis, mood swings, change in appetite, change in weight, increased thirst Hematologic/Lymphatic:  No anemia, purpura, petechiae. Allergic/Immunologic: no itchy/runny eyes, nasal congestion, recent  allergic reactions, rashes  PHYSICAL EXAM: Vitals:   11/17/18 0833  BP: 114/68  Pulse: 62  SpO2: 99%   General: No acute distress Head:  Normocephalic/atraumatic Neck: supple, no paraspinal tenderness, full range of motion Heart:  Regular rate and rhythm Lungs:  Clear to auscultation bilaterally Back: No paraspinal tenderness Skin/Extremities: No rash, no edema Neurological Exam: alert and oriented to person, place, and time. No aphasia or dysarthria. Fund of knowledge is appropriate.  Recent and remote memory are intact.  Attention and concentration are normal.    Able to name objects and repeat phrases. Cranial nerves: Pupils equal, round, reactive to light. Extraocular movements intact with no nystagmus. Visual fields full. Facial sensation intact. No facial asymmetry. Tongue, uvula, palate midline.  Motor: Bulk and tone normal, muscle strength 5/5 throughout with no pronator drift.  Sensation to light touch intact.  No extinction to double simultaneous stimulation. Finger to nose testing intact.  Gait narrow-based and steady, able to tandem walk adequately.    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 She has had only 3 seizures in her lifetime, last seizure in 2009. She recently delivered a healthy baby boy, Lamotrigine dose increased during pregnancy. She feels more comfortable on higher dose Lamotrigine XR 400mg  qhs, no side effects. 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 She is aware of [place] driving laws to stop driving after a seizure, until 6 months seizure-free. She will follow-up in 6-8 months and knows to call for any changes.   Thank you for allowing me to participate in her care.  Please do not hesitate to call for any questions or concerns.  The duration of this appointment visit was 20 minutes of face-to-face time with the patient.  Greater than 50% of this time was spent in  counseling, explanation of diagnosis, planning of further management, and coordination of care.   Patrcia Dolly, M.D.   CC: Dr. Duaine Dredge

## 2018-12-24 ENCOUNTER — Telehealth: Payer: Self-pay | Admitting: Neurology

## 2018-12-24 ENCOUNTER — Other Ambulatory Visit: Payer: Self-pay | Admitting: Neurology

## 2018-12-24 MED ORDER — LAMOTRIGINE ER 200 MG PO TB24: ORAL_TABLET | ORAL | 0 refills | Status: DC

## 2018-12-24 NOTE — Telephone Encounter (Signed)
Spoke with pt advising her of message below from pharmacy.  Pt states that she only has enough Lamotrigine to last tonight and that her last status with mail order was that it has not been shipped yet.  I let pt know that I will send 7 day supply to [facility] with note to pharmacy stating that pt out of medications and this should cover her until mail order is delivered.  Hopefully this will help with out of pocket cost to pt.

## 2018-12-24 NOTE — Telephone Encounter (Signed)
Lamotrigine 243m #14 with no refills Sig = take 2 tab QHS  [facility] [address] [facility] [address] note to pharm that this is bridge Rx

## 2018-12-24 NOTE — Telephone Encounter (Signed)
Patient needs a RX for the lamotrigine 200 mg sent to the [facility] on [address]

## 2018-12-24 NOTE — Telephone Encounter (Signed)
[facility] and spoke with pharmacist.  She states that pt has plenty of refills available with them, however pt's insurance will not cover medication as pt's current Rx is being mailed to her now.

## 2019-03-09 ENCOUNTER — Other Ambulatory Visit: Payer: Self-pay | Admitting: Family Medicine

## 2019-03-09 ENCOUNTER — Other Ambulatory Visit: Payer: Self-pay

## 2019-03-09 ENCOUNTER — Ambulatory Visit
Admission: RE | Admit: 2019-03-09 | Discharge: 2019-03-09 | Disposition: A | Discharge status: Home / Self Care | Payer: 59 | Source: Ambulatory Visit | Attending: Family Medicine | Admitting: Family Medicine

## 2019-03-09 DIAGNOSIS — M79675 Pain in left toe(s): Secondary | ICD-10-CM

## 2019-06-24 ENCOUNTER — Ambulatory Visit: Payer: 59 | Admitting: Neurology

## 2019-07-11 ENCOUNTER — Encounter: Payer: Self-pay | Admitting: Neurology

## 2019-07-11 ENCOUNTER — Other Ambulatory Visit: Payer: Self-pay

## 2019-07-11 ENCOUNTER — Telehealth (INDEPENDENT_AMBULATORY_CARE_PROVIDER_SITE_OTHER): Payer: 59 | Admitting: Neurology

## 2019-07-11 VITALS — Ht 59.0 in | Wt 148.0 lb

## 2019-07-11 DIAGNOSIS — G40009 Localization-related (focal) (partial) idiopathic epilepsy and epileptic syndromes with seizures of localized onset, not intractable, without status epilepticus: Secondary | ICD-10-CM | POA: Diagnosis not present

## 2019-07-11 DIAGNOSIS — G4483 Primary cough headache: Secondary | ICD-10-CM

## 2019-07-11 DIAGNOSIS — G43829 Menstrual migraine, not intractable, without status migrainosus: Secondary | ICD-10-CM

## 2019-07-11 MED ORDER — LAMOTRIGINE ER 200 MG PO TB24: ORAL_TABLET | ORAL | 3 refills | Status: DC

## 2019-07-11 NOTE — Progress Notes (Signed)
Virtual Visit via Video Note The purpose of this virtual visit is to provide medical care while limiting exposure to the novel coronavirus.    Consent was obtained for video visit:  Yes.   Answered questions that patient had about telehealth interaction:  Yes.   I discussed the limitations, risks, security and privacy concerns of performing an evaluation and management service by telemedicine. I also discussed with the patient that there may be a patient responsible charge related to this service. The patient expressed understanding and agreed to proceed.  Pt location: Home Physician Location: office Name of referring provider:  Mosetta Putt, MD I connected with Desiree Marshall at patients initiation/request on 07/11/2019 at  1:30 PM EDT by video enabled telemedicine application and verified that I am speaking with the correct person using two identifiers. Pt MRN:  [id number] Pt DOB:  01/30/1981 Video Participants:  Desiree Marshall   History of Present Illness:  The patient was seen as a virtual video visit on 07/11/2019. She was last seen 8 months ago for seizures and migraines. Since her last visit, she continues to do well seizure-free since 2009. She is on Lamotrigine ER 400mg  daily without side effects. She denies any staring/unresponsive episodes, gaps in time, olfactory/gustatory hallucinations, focal numbness/tingling/weakness, myoclonic jerks. She has migraines around the time of her period with good response to Imitrex. On her last visit, she reported that the cough headaches had completely disappeared during pregnancy, then came back sporadically after delivery. She reports having brief headaches when she coughs or laughs loud, occurring every other day. They are not very bothersome for her. Cough does not wake her up from sleep. She denies any dizziness, vision changes, no falls.   History on Initial Assessment 11/03/2017: This is a pleasant 38 yo RH woman with a history of  Crohn's disease, migraines, and seizures. She had been seeing neurologist Dr. 20, records were reviewed. She started having bad migraines in high school, usually with left frontal pressure. She used to have nausea/vomiting, none since having Imitrex available. No photo/phonophobia. They usually occur with her menstrual period, she usually gets 3-4 migraines a month during the time of her menses. No side effects on Imitrex. She is concerned about a different type of pain this past year, when she laughs, sneezes, or bears down, she would have a brief diffuse sharp pain in her head like she is dizzy, then goes away after a second. There is no associated confusion, focal numbness/tingling/weakness, nausea/vomiting, visual obscurations. Poor sleep is a trigger for her migraines. No family history of migraines. She denies any tinnitus, diplopia, dysarthria/dysphagia, neck pain. She has occasional dizziness upon standing a couple of times a month lasting a few minutes. She usually gets 6-7 hours of sleep. No history of head injuries.   Seizures started in her teens, she would have speech difficulties where she thinks a word but can't say it. She would feel "foggy brain," then have a generalized convulsion. She has had only 3 seizures in her lifetime, the second occurred when she was tried to wean off Tegretol, and the last seizure occurred in 2009 when she missed a dose of Lamotrigine. She is taking Lamotrigine XR 200mg  daily without side effects.  She denies any olfactory/gustatory hallucinations, deja vu, rising epigastric sensation, focal numbness/tingling/weakness, myoclonic jerks.  Epilepsy Risk Factors:  She had a normal birth and early development.  There is no history of febrile convulsions, CNS infections such as meningitis/encephalitis, significant traumatic brain injury, neurosurgical procedures,  or family history of seizures.  Diagnostic Data: EEG in 04/2016 was normal. 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 Lamictal level 11/2016: 5.0     Current Outpatient Medications on File Prior to Visit  Medication Sig Dispense Refill  . azaTHIOprine (IMURAN) 50 MG tablet Take 100 mg by mouth daily.    Marland Kitchen ibuprofen (ADVIL,MOTRIN) 800 MG tablet Take 1 tablet (800 mg total) by mouth 3 (three) times daily. 30 tablet 0  . InFLIXimab (REMICADE IV) Inject 1 each into the vein every 8 (eight) weeks.     . LamoTRIgine 200 MG TB24 24 hour tablet TAKE 2 TABLETS AT BEDTIME 14 tablet 0  . loratadine (CLARITIN) 10 MG tablet Take 10 mg by mouth daily.    . sertraline (ZOLOFT) 100 MG tablet Take 100 mg by mouth at bedtime.     No current facility-administered medications on file prior to visit.      Observations/Objective:   Vitals:   07/11/19 1306  Weight: 148 lb (67.1 kg)  Height: 4\' 11"  (1.499 m)   GEN:  The patient appears stated age and is in NAD.  Neurological examination: Patient is awake, alert, oriented x 3. No aphasia or dysarthria. Intact fluency and comprehension. Remote and recent memory intact. Able to name and repeat. Cranial nerves: Extraocular movements intact with no nystagmus. No facial asymmetry. Motor: moves all extremities symmetrically, at least anti-gravity x 4. No incoordination on finger to nose testing. Gait: narrow-based and steady, able to tandem walk adequately. Negative Romberg test.  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 She has had only 3 seizures in her lifetime, last seizure in 2009. Continue Lamotrigine ER 400mg  daily, refills sent. 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 Cough headaches are not bothersome, continue to monitor. She is aware of [place] driving laws to stop driving after a seizure, until 6 months seizure-free. Follow-up in 8 months, she knows to call for any changes.   Follow Up Instructions:   -I discussed the assessment and treatment plan with the patient. The patient was provided an opportunity to ask questions and all were answered. The patient agreed with the plan and demonstrated an understanding of the instructions.   The patient was advised to call back or seek an in-person evaluation if the symptoms worsen or if the condition fails to improve as anticipated.     Cameron Sprang, MD

## 2020-01-25 ENCOUNTER — Other Ambulatory Visit: Payer: Self-pay

## 2020-01-25 ENCOUNTER — Telehealth: Payer: Self-pay | Admitting: Neurology

## 2020-01-25 MED ORDER — LAMOTRIGINE ER 200 MG PO TB24: ORAL_TABLET | ORAL | 0 refills | Status: DC

## 2020-01-25 NOTE — Telephone Encounter (Signed)
Patient called to request a refill on Lamotrigine.

## 2020-01-30 ENCOUNTER — Other Ambulatory Visit: Payer: Self-pay

## 2020-01-30 NOTE — Telephone Encounter (Signed)
Has this been followed up on so encounter can be closed?

## 2020-01-30 NOTE — Telephone Encounter (Signed)
Can close encounter

## 2020-03-12 ENCOUNTER — Encounter: Payer: Self-pay | Admitting: Neurology

## 2020-03-12 ENCOUNTER — Telehealth (INDEPENDENT_AMBULATORY_CARE_PROVIDER_SITE_OTHER): Payer: 59 | Admitting: Neurology

## 2020-03-12 ENCOUNTER — Other Ambulatory Visit: Payer: Self-pay

## 2020-03-12 VITALS — Ht 59.0 in | Wt 150.0 lb

## 2020-03-12 DIAGNOSIS — G40009 Localization-related (focal) (partial) idiopathic epilepsy and epileptic syndromes with seizures of localized onset, not intractable, without status epilepticus: Secondary | ICD-10-CM

## 2020-03-12 DIAGNOSIS — G43829 Menstrual migraine, not intractable, without status migrainosus: Secondary | ICD-10-CM

## 2020-03-12 MED ORDER — LAMOTRIGINE ER 200 MG PO TB24: ORAL_TABLET | ORAL | 3 refills | Status: DC

## 2020-03-12 NOTE — Progress Notes (Signed)
Virtual Visit via Video Note The purpose of this virtual visit is to provide medical care while limiting exposure to the novel coronavirus.    Consent was obtained for video visit:  Yes.   Answered questions that patient had about telehealth interaction:  Yes.   I discussed the limitations, risks, security and privacy concerns of performing an evaluation and management service by telemedicine. I also discussed with the patient that there may be a patient responsible charge related to this service. The patient expressed understanding and agreed to proceed.  Pt location: Home Physician Location: office Name of referring provider:  Derinda Late, MD I connected with [name] at patients initiation/request on 03/12/2020 at  3:00 PM EDT by video enabled telemedicine application and verified that I am speaking with the correct person using two identifiers. Pt MRN:  [id number] Pt DOB:  [birth year] Video Participants:  [name]   History of Present Illness: The patient had a virtual video visit on 03/12/2020. She was last seen 8 months ago for seizures and migraines. She continues to do well seizure-free since 2009. She is taking Lamotrigine ER 441m qhs without side effects. She denies any staring/unresponsive episodes, gaps in time, olfactory/gustatory hallucinations, focal numbness/tingling/weakness, myoclonic jerks. She denies any stress/cough headaches. She was having more migraines, more when on birth control, occurring 2-3 times a week, but since she stopped OCPs, she is back to baseline migraines occurring only during her period with good response to prn Imitrex. She denies any dizziness, diplopia, gait instability, no falls. Sleep and mood are good.   History on Initial Assessment 11/03/2017: This is a pleasant 39yo RH woman with a history of Crohn's disease, migraines, and seizures. She had been seeing neurologist Dr. YKrista Blue records were reviewed. She started having bad  migraines in high school, usually with left frontal pressure. She used to have nausea/vomiting, none since having Imitrex available. No photo/phonophobia. They usually occur with her menstrual period, she usually gets 3-4 migraines a month during the time of her menses. No side effects on Imitrex. She is concerned about a different type of pain this past year, when she laughs, sneezes, or bears down, she would have a brief diffuse sharp pain in her head like she is dizzy, then goes away after a second. There is no associated confusion, focal numbness/tingling/weakness, nausea/vomiting, visual obscurations. Poor sleep is a trigger for her migraines. No family history of migraines. She denies any tinnitus, diplopia, dysarthria/dysphagia, neck pain. She has occasional dizziness upon standing a couple of times a month lasting a few minutes. She usually gets 6-7 hours of sleep. No history of head injuries.   Seizures started in her teens, she would have speech difficulties where she thinks a word but can't say it. She would feel "foggy brain," then have a generalized convulsion. She has had only 3 seizures in her lifetime, the second occurred when she was tried to wean off Tegretol, and the last seizure occurred in 2009 when she missed a dose of Lamotrigine. She is taking Lamotrigine XR 2042mdaily without side effects.  She denies any olfactory/gustatory hallucinations, deja vu, rising epigastric sensation, focal numbness/tingling/weakness, myoclonic jerks.  Epilepsy Risk Factors:  She had a normal birth and early development.  There is no history of febrile convulsions, CNS infections such as meningitis/encephalitis, significant traumatic brain injury, neurosurgical procedures, or family history of seizures.  Diagnostic Data: EEG in 04/2016 was normal. 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 Lamictal level 11/2016: 5.0    Current Outpatient Medications on File Prior to Visit  Medication Sig Dispense Refill  . azaTHIOprine (IMURAN) 50 MG tablet Take 100 mg by mouth daily.    .Marland Kitchenibuprofen (ADVIL,MOTRIN) 800 MG tablet Take 1 tablet (800 mg total) by mouth 3 (three) times daily. 30 tablet 0  . InFLIXimab (REMICADE IV) Inject 1 each into the vein every 8 (eight) weeks.     . LamoTRIgine 200 MG TB24 24 hour tablet TAKE 2 TABLETS AT BEDTIME 180 tablet 0  . loratadine (CLARITIN) 10 MG tablet Take 10 mg by mouth daily.    . sertraline (ZOLOFT) 100 MG tablet Take 100 mg by mouth at bedtime.     No current facility-administered medications on file prior to visit.     Observations/Objective:   Vitals:   03/12/20 1320  Weight: 150 lb (68 kg)  Height: 4' 11"  (1.499 m)   GEN:  The patient appears stated age and is in NAD.  Neurological examination: Patient is awake, alert, oriented x 3. No aphasia or dysarthria. Intact fluency and comprehension. Remote and recent memory intact. Cranial nerves: Extraocular movements intact with no nystagmus. No facial asymmetry. Motor: moves all extremities symmetrically, at least anti-gravity x 4. No incoordination on finger to nose testing. Gait: narrow-based and steady, able to tandem walk adequately.    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 She has had only 3 seizures in her lifetime, and has been seizure-free since 2009 on Lamotrigine ER 4057mqhs refills sent. Cough headaches have been quiet. 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 She knows to call our office for any change in symptoms. She is aware of [place] driving laws to stop driving after a seizure, until 6 months seizure-free. Follow-up in 1 year.    Follow Up Instructions:   -I discussed the assessment  and treatment plan with the patient. The patient was provided an opportunity to ask questions and all were answered. The patient agreed with the plan and demonstrated an understanding of the instructions.   The patient was advised to call back or seek an in-person evaluation if the symptoms worsen or if the condition fails to improve as anticipated.     KaCameron SprangMD

## 2020-10-26 ENCOUNTER — Other Ambulatory Visit: Payer: Self-pay | Admitting: Family Medicine

## 2020-10-27 LAB — SARS CORONAVIRUS 2 (TAT 6-24 HRS): SARS Coronavirus 2: NEGATIVE

## 2020-10-30 ENCOUNTER — Other Ambulatory Visit: Payer: Self-pay | Admitting: Family Medicine

## 2020-10-31 LAB — SARS CORONAVIRUS 2 (TAT 6-24 HRS): SARS Coronavirus 2: NEGATIVE

## 2021-03-13 ENCOUNTER — Telehealth (INDEPENDENT_AMBULATORY_CARE_PROVIDER_SITE_OTHER): Payer: 59 | Admitting: Neurology

## 2021-03-13 ENCOUNTER — Encounter: Payer: Self-pay | Admitting: Neurology

## 2021-03-13 ENCOUNTER — Other Ambulatory Visit: Payer: Self-pay

## 2021-03-13 VITALS — Ht 59.0 in | Wt 154.0 lb

## 2021-03-13 DIAGNOSIS — G40009 Localization-related (focal) (partial) idiopathic epilepsy and epileptic syndromes with seizures of localized onset, not intractable, without status epilepticus: Secondary | ICD-10-CM | POA: Diagnosis not present

## 2021-03-13 DIAGNOSIS — G43829 Menstrual migraine, not intractable, without status migrainosus: Secondary | ICD-10-CM | POA: Diagnosis not present

## 2021-03-13 MED ORDER — LAMOTRIGINE ER 200 MG PO TB24: ORAL_TABLET | ORAL | 3 refills | Status: DC

## 2021-03-13 NOTE — Progress Notes (Signed)
Virtual Visit via Video Note The purpose of this virtual visit is to provide medical care while limiting exposure to the novel coronavirus.    Consent was obtained for video visit:  Yes.   Answered questions that patient had about telehealth interaction:  Yes.   I discussed the limitations, risks, security and privacy concerns of performing an evaluation and management service by telemedicine. I also discussed with the patient that there may be a patient responsible charge related to this service. The patient expressed understanding and agreed to proceed.  Pt location: Private vehicle Physician Location: office Name of referring provider:  Derinda Late, MD I connected with Desiree Marshall at patients initiation/request on 03/13/2021 at  8:30 AM EDT by video enabled telemedicine application and verified that I am speaking with the correct person using two identifiers. Pt MRN:  [id number] Pt DOB:  04-28-1981 Video Participants:  Desiree Marshall   History of Present Illness:  The patient had a virtual video visit on 03/13/2021. She was last seen a year ago for seizures and migraines. Since her last visit, she reports doing quite well. She had a GI virus in March/April for a couple of weeks, otherwise she had been fine. She denies any seizures since 2009 on Lamotrigine ER 454m qhs with no side effects. The migraines are much better, she gets around one a month around the time of her period. She denies any staring/unresponsive episodes, focal numbness/tingling/weakness, myoclonic jerks. She smells metal when she gets her infusions and Solumedrol. She still has trouble sleeping at night. Mood is good. No falls.   History on Initial Assessment 11/03/2017: This is a pleasant 40yo RH woman with a history of Crohn's disease, migraines, and seizures. She had been seeing neurologist Dr. YKrista Marshall records were reviewed. She started having bad migraines in high school, usually with left frontal pressure.  She used to have nausea/vomiting, none since having Imitrex available. No photo/phonophobia. They usually occur with her menstrual period, she usually gets 3-4 migraines a month during the time of her menses. No side effects on Imitrex. She is concerned about a different type of pain this past year, when she laughs, sneezes, or bears down, she would have a brief diffuse sharp pain in her head like she is dizzy, then goes away after a second. There is no associated confusion, focal numbness/tingling/weakness, nausea/vomiting, visual obscurations. Poor sleep is a trigger for her migraines. No family history of migraines. She denies any tinnitus, diplopia, dysarthria/dysphagia, neck pain. She has occasional dizziness upon standing a couple of times a month lasting a few minutes. She usually gets 6-7 hours of sleep. No history of head injuries.    Seizures started in her teens, she would have speech difficulties where she thinks a word but can't say it. She would feel "foggy brain," then have a generalized convulsion. She has had only 3 seizures in her lifetime, the second occurred when she was tried to wean off Tegretol, and the last seizure occurred in 2009 when she missed a dose of Lamotrigine. She is taking Lamotrigine XR 2059mdaily without side effects.  She denies any olfactory/gustatory hallucinations, deja vu, rising epigastric sensation, focal numbness/tingling/weakness, myoclonic jerks.   Epilepsy Risk Factors:  She had a normal birth and early development.  There is no history of febrile convulsions, CNS infections such as meningitis/encephalitis, significant traumatic brain injury, neurosurgical procedures, or family history of seizures.   Diagnostic Data: EEG in 04/2016 was normal. 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 Lamictal level 11/2016: 5.0    Current Outpatient Medications on File Prior to Visit  Medication Sig Dispense Refill   azaTHIOprine (IMURAN) 50 MG tablet Take 100 mg by mouth daily.     ibuprofen (ADVIL,MOTRIN) 800 MG tablet Take 1 tablet (800 mg total) by mouth 3 (three) times daily. 30 tablet 0   InFLIXimab (REMICADE IV) Inject 1 each into the vein every 8 (eight) weeks.      LamoTRIgine 200 MG TB24 24 hour tablet TAKE 2 TABLETS AT BEDTIME 180 tablet 3   loratadine (CLARITIN) 10 MG tablet Take 10 mg by mouth daily.     sertraline (ZOLOFT) 100 MG tablet Take 100 mg by mouth at bedtime.     No current facility-administered medications on file prior to visit.     Observations/Objective:   Vitals:   03/13/21 0813  Weight: 154 lb (69.9 kg)  Height: 4' 11"  (1.499 m)   GEN:  The patient appears stated age and is in NAD.  Neurological examination: Patient is awake, alert. No aphasia or dysarthria. Intact fluency and comprehension. Remote and recent memory intact. Cranial nerves: Extraocular movements intact. No facial asymmetry. Motor: moves all extremities symmetrically, at least anti-gravity x 4.   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 She has had only 3 seizures in her lifetime, and has been seizure-free since 2009 on Lamotrigine ER 4010mqhs. She reports sleep difficulties and will try taking the Lamotrigine 40074mvery morning. Cough headaches have been quiet. She has catamenial migraines and reports she has been doing well with these. She is aware of [place] driving laws to stop driving after a seizure until 6 months seizure-free. Follow-up in 1 year, call for any changes.    Follow Up Instructions:   -I discussed the assessment and treatment plan with the patient. The patient was provided an opportunity to ask questions and all were answered. The patient agreed with the plan and  demonstrated an understanding of the instructions.   The patient was advised to call back or seek an in-person evaluation if the symptoms worsen or if the condition fails to improve as anticipated.     KarCameron SprangD
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
## 2021-06-13 ENCOUNTER — Emergency Department (HOSPITAL_BASED_OUTPATIENT_CLINIC_OR_DEPARTMENT_OTHER): Payer: 59

## 2021-06-13 ENCOUNTER — Other Ambulatory Visit: Payer: Self-pay

## 2021-06-13 ENCOUNTER — Emergency Department (HOSPITAL_BASED_OUTPATIENT_CLINIC_OR_DEPARTMENT_OTHER)
Admission: EM | Admit: 2021-06-13 | Discharge: 2021-06-13 | Disposition: A | Discharge status: Home / Self Care | Payer: 59 | Attending: Emergency Medicine | Admitting: Emergency Medicine

## 2021-06-13 ENCOUNTER — Encounter (HOSPITAL_BASED_OUTPATIENT_CLINIC_OR_DEPARTMENT_OTHER): Payer: Self-pay | Admitting: Emergency Medicine

## 2021-06-13 DIAGNOSIS — S7001XA Contusion of right hip, initial encounter: Secondary | ICD-10-CM | POA: Diagnosis not present

## 2021-06-13 DIAGNOSIS — S79911A Unspecified injury of right hip, initial encounter: Secondary | ICD-10-CM | POA: Diagnosis present

## 2021-06-13 DIAGNOSIS — M791 Myalgia, unspecified site: Secondary | ICD-10-CM | POA: Insufficient documentation

## 2021-06-13 DIAGNOSIS — N189 Chronic kidney disease, unspecified: Secondary | ICD-10-CM | POA: Diagnosis not present

## 2021-06-13 DIAGNOSIS — Y92410 Unspecified street and highway as the place of occurrence of the external cause: Secondary | ICD-10-CM | POA: Diagnosis not present

## 2021-06-13 LAB — PREGNANCY, URINE: Preg Test, Ur: NEGATIVE
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
## 2021-07-03 ENCOUNTER — Other Ambulatory Visit: Payer: Self-pay | Admitting: Family Medicine

## 2021-07-04 LAB — SARS CORONAVIRUS 2 (TAT 6-24 HRS): SARS Coronavirus 2: NEGATIVE

## 2021-09-18 ENCOUNTER — Other Ambulatory Visit (HOSPITAL_COMMUNITY): Payer: Self-pay | Admitting: Family Medicine

## 2021-09-18 DIAGNOSIS — R3129 Other microscopic hematuria: Secondary | ICD-10-CM

## 2021-10-02 ENCOUNTER — Ambulatory Visit (HOSPITAL_COMMUNITY)
Admission: RE | Admit: 2021-10-02 | Discharge: 2021-10-02 | Disposition: A | Discharge status: Home / Self Care | Payer: 59 | Source: Ambulatory Visit | Attending: Family Medicine | Admitting: Family Medicine

## 2021-10-02 ENCOUNTER — Other Ambulatory Visit: Payer: Self-pay

## 2021-10-02 DIAGNOSIS — R3129 Other microscopic hematuria: Secondary | ICD-10-CM | POA: Diagnosis present

## 2021-10-02 MED ORDER — SODIUM CHLORIDE 0.9 % IV SOLN
INTRAVENOUS | Status: AC
  Filled 2021-10-02: qty 250

## 2021-10-02 MED ORDER — IOHEXOL 350 MG/ML SOLN
120.0000 mL | Freq: Once | INTRAVENOUS | Status: AC | PRN
  Administered 2021-10-02: 120 mL via INTRAVENOUS

## 2021-10-02 MED ORDER — SODIUM CHLORIDE (PF) 0.9 % IJ SOLN
INTRAMUSCULAR | Status: AC
  Filled 2021-10-02: qty 50

## 2022-03-10 ENCOUNTER — Other Ambulatory Visit: Payer: Self-pay | Admitting: Neurology

## 2022-03-14 ENCOUNTER — Encounter: Payer: Self-pay | Admitting: Neurology

## 2022-03-14 ENCOUNTER — Ambulatory Visit (INDEPENDENT_AMBULATORY_CARE_PROVIDER_SITE_OTHER): Payer: 59 | Admitting: Neurology

## 2022-03-14 ENCOUNTER — Other Ambulatory Visit (INDEPENDENT_AMBULATORY_CARE_PROVIDER_SITE_OTHER): Payer: 59

## 2022-03-14 VITALS — BP 121/80 | HR 83 | Ht 59.0 in | Wt 156.4 lb

## 2022-03-14 DIAGNOSIS — G43829 Menstrual migraine, not intractable, without status migrainosus: Secondary | ICD-10-CM

## 2022-03-14 DIAGNOSIS — G40009 Localization-related (focal) (partial) idiopathic epilepsy and epileptic syndromes with seizures of localized onset, not intractable, without status epilepticus: Secondary | ICD-10-CM

## 2022-03-14 MED ORDER — LAMOTRIGINE ER 200 MG PO TB24: 2.0000 | ORAL_TABLET | Freq: Every morning | ORAL | 3 refills | Status: DC

## 2022-03-14 NOTE — Progress Notes (Signed)
NEUROLOGY FOLLOW UP OFFICE NOTE  Desiree Marshall [id number] Aug 13, 1981  HISTORY OF PRESENT ILLNESS: I had the pleasure of seeing Desiree Marshall in follow-up in the neurology clinic on 03/14/2022.  The patient was last seen a year ago for seizures and migraines. She is alone in the office today. Records and images were personally reviewed where available.  Since her last visit, symptoms have been stable. She denies any seizures since 2009 on Lamotrigine ER 418m every morning, no side effects. She denies any staring/unresponsive episodes, gaps in time, olfactory/gustatory hallucinations, focal numbness/tingling/weakness, myoclonic jerks. She has migraines around once a month around the time of her period. She has had a handful of episodes where the room is spinning for a few seconds, she holds on or sits and it passes, no nausea/vomiting, no falls. She is on oral contraception. She had a flare of Crohns disease and is on a different medication. Mood and sleep are good.   History on Initial Assessment 11/03/2017: This is a pleasant 41yo RH woman with a history of Crohn's disease, migraines, and seizures. She had been seeing neurologist Dr. YKrista Blue records were reviewed. She started having bad migraines in high school, usually with left frontal pressure. She used to have nausea/vomiting, none since having Imitrex available. No photo/phonophobia. They usually occur with her menstrual period, she usually gets 3-4 migraines a month during the time of her menses. No side effects on Imitrex. She is concerned about a different type of pain this past year, when she laughs, sneezes, or bears down, she would have a brief diffuse sharp pain in her head like she is dizzy, then goes away after a second. There is no associated confusion, focal numbness/tingling/weakness, nausea/vomiting, visual obscurations. Poor sleep is a trigger for her migraines. No family history of migraines. She denies any tinnitus, diplopia,  dysarthria/dysphagia, neck pain. She has occasional dizziness upon standing a couple of times a month lasting a few minutes. She usually gets 6-7 hours of sleep. No history of head injuries.    Seizures started in her teens, she would have speech difficulties where she thinks a word but can't say it. She would feel "foggy brain," then have a generalized convulsion. She has had only 3 seizures in her lifetime, the second occurred when she was tried to wean off Tegretol, and the last seizure occurred in 2009 when she missed a dose of Lamotrigine. She is taking Lamotrigine XR 2082mdaily without side effects.  She denies any olfactory/gustatory hallucinations, deja vu, rising epigastric sensation, focal numbness/tingling/weakness, myoclonic jerks.   Epilepsy Risk Factors:  She had a normal birth and early development.  There is no history of febrile convulsions, CNS infections such as meningitis/encephalitis, significant traumatic brain injury, neurosurgical procedures, or family history of seizures.   Diagnostic Data: EEG in 04/2016 was normal. 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 Lamictal level 11/2016: 5.0  PAST MEDICAL HISTORY: Past Medical History:  Diagnosis Date   Anemia    Resolved   Astigmatism    Chronic kidney disease    Crohn's disease ([facility]  Eczema    Epilepsy ([facility]  Diagnosed at the age of 41.69  Gallstone    Kidney stone    Migraine    Seizures ([facility])     MEDICATIONS: Current Outpatient Medications on  File Prior to Visit  Medication Sig Dispense Refill   azaTHIOprine (IMURAN) 50 MG tablet Take 100 mg by mouth daily.     ibuprofen (ADVIL,MOTRIN) 800 MG tablet Take 1 tablet (800 mg total) by mouth 3 (three) times daily. 30 tablet 0   LamoTRIgine 200 MG TB24 24 hour tablet TAKE  2 TABLETS EVERY MORNING 60 tablet 0   loratadine (CLARITIN) 10 MG tablet Take 10 mg by mouth daily.     Risankizumab-rzaa (SKYRIZI) 360 MG/2.4ML SOCT Inject 375m with OBI at week 12 post completion of IV loading doses, then every 8weeks maintenance     sertraline (ZOLOFT) 100 MG tablet Take 100 mg by mouth at bedtime.     No current facility-administered medications on file prior to visit.    ALLERGIES: Allergies  Allergen Reactions   Promethazine Hcl Other (See Comments)    Pt states that this medication causes hallucinations.    Metoclopramide Other (See Comments)    Patient had pretty severe akathisia with 10 mg IV.  Use low-dose with this patient, consider 2.5 mg and be ready to give Benadryl.    FAMILY HISTORY: Family History  Problem Relation Age of Onset   Asthma Mother    Hypertension Mother    Allergic rhinitis Mother    Cancer Father 537      Prostate Cancer   Heart disease Maternal Grandmother    Heart disease Maternal Grandfather    Stroke Paternal Grandmother    Heart disease Paternal Grandfather    Allergic rhinitis Brother    Angioedema Neg Hx    Atopy Neg Hx    Eczema Neg Hx    Immunodeficiency Neg Hx    Urticaria Neg Hx     SOCIAL HISTORY: Social History   Socioeconomic History   Marital status: Married    Spouse name: CForensic scientist   Number of children: 1   Years of education: Masters   Highest education level: Not on file  Occupational History   Occupation: HOMEMAKER   Tobacco Use   Smoking status: Never   Smokeless tobacco: Never  Vaping Use   Vaping Use: Never used  Substance and Sexual Activity   Alcohol use: Not Currently   Drug use: Never   Sexual activity: Yes    Partners: Male    Birth control/protection: None  Other Topics Concern   Not on file  Social History Narrative   ** Merged History Encounter **       Marital Status:  Married (Therapist, art    Children:  2 Sons ([place]    Pets: Dog (1)    Living Situation: Lives  with husband and son   Occupation: HAgricultural engineer   Education: MConservator, [place]  [place]    Alc   ohol Use:  Occasional   Drug Use:  None   Diet:  Regular   Exercise:  Walking - once weekly    Hobbies: Baking - Sewing    Right-handed.   2 cups caffeine daily.   Two story home            Social Determinants of Health   Financial Resource Strain: Low Risk  (08/17/2018)   Overall Financial Resource Strain (CARDIA)    Difficulty of Paying Living Expenses: Not hard at all  Food Insecurity: Unknown (08/17/2018)   Hunger Vital Sign    Worried About Running Out of Food in the Last Year: Patient  refused    Ran Out of Food in the Last Year: Patient refused  Transportation Needs: Unknown (08/17/2018)   PRAPARE - Hydrologist (Medical): Patient refused    Lack of Transportation (Non-Medical): Patient refused  Physical Activity: Not on file  Stress: Not on file  Social Connections: Not on file  Intimate Partner Violence: Not on file     PHYSICAL EXAM: Vitals:   03/14/22 0837  BP: 121/80  Pulse: 83  SpO2: 98%   General: No acute distress Head:  Normocephalic/atraumatic Skin/Extremities: No rash, no edema Neurological Exam: alert and awake. No aphasia or dysarthria. Fund of knowledge is appropriate. Attention and concentration are normal.   Cranial nerves: Pupils equal, round. Extraocular movements intact with no nystagmus. Visual fields full.  No facial asymmetry.  Motor: Bulk and tone normal, muscle strength 5/5 throughout with no pronator drift.   Finger to nose testing intact.  Gait narrow-based and steady, able to tandem walk adequately.  Romberg negative.   IMPRESSION: This is a pleasant 41 yo RH woman with a history of focal to bilateral tonic-clonic epilepsy and migraines. She has had only 3 seizures in her lifetime, and has been seizure-free since 2009 on Lamotrigine ER 416m daily, refills sent.  She has had a change in medications, we will repeat a baseline Lamictal level. Catamenial migraines stable. She is aware of [place] driving laws to stop driving after a seizure until 6 months seizure-free. No current pregnancy plans. Follow-up in 1 year, call for any changes.    Thank you for allowing me to participate in her care.  Please do not hesitate to call for any questions or concerns.    KEllouise Newer M.D.   CC: Dr. BSandi Mariscal
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
## 2022-03-17 LAB — LAMOTRIGINE LEVEL: Lamotrigine Lvl: 2.7 ug/mL (ref 2.5–15.0)

## 2022-03-18 ENCOUNTER — Other Ambulatory Visit: Payer: Self-pay

## 2022-03-18 ENCOUNTER — Telehealth: Payer: Self-pay | Admitting: Neurology

## 2022-03-18 DIAGNOSIS — G40009 Localization-related (focal) (partial) idiopathic epilepsy and epileptic syndromes with seizures of localized onset, not intractable, without status epilepticus: Secondary | ICD-10-CM

## 2022-03-18 NOTE — Telephone Encounter (Signed)
Lab order sent to [place] at [facility]

## 2022-03-18 NOTE — Telephone Encounter (Signed)
Called patient re: Lamictal level 2.7. It is within range, but on lower range of therapeutic, also a drop from her prior levels. Discussed repeating level in a week. We may consider increasing dose of Lamictal depending on repeat dose. She requests lab order for Lamictal be sent to [facility] in [place] in [place]. Renee, pls fax order for Lamictal level. Thanks

## 2022-03-26 ENCOUNTER — Other Ambulatory Visit: Payer: Self-pay | Admitting: Neurology

## 2022-03-28 LAB — LAMOTRIGINE LEVEL: Lamotrigine Lvl: 7.4 ug/mL (ref 2.0–20.0)

## 2022-03-31 NOTE — Progress Notes (Signed)
Patient advised of her lab results.

## 2022-07-04 ENCOUNTER — Telehealth: Payer: Self-pay | Admitting: Neurology

## 2022-07-04 NOTE — Telephone Encounter (Signed)
Patient missed a dose of Lamictal and is concerned.

## 2022-07-04 NOTE — Telephone Encounter (Signed)
Spoke with pt she has had no symptoms she is back on track with her medication, pt advised to try and stay on track with medication and not miss any more . Pt verbalized understanding,

## 2022-12-17 ENCOUNTER — Encounter (HOSPITAL_BASED_OUTPATIENT_CLINIC_OR_DEPARTMENT_OTHER): Payer: Self-pay

## 2022-12-17 DIAGNOSIS — R400 Somnolence: Secondary | ICD-10-CM

## 2023-01-12 ENCOUNTER — Ambulatory Visit (HOSPITAL_BASED_OUTPATIENT_CLINIC_OR_DEPARTMENT_OTHER): Payer: 59 | Attending: Family Medicine | Admitting: Internal Medicine

## 2023-01-12 VITALS — Ht 59.0 in | Wt 157.0 lb

## 2023-01-12 DIAGNOSIS — G4733 Obstructive sleep apnea (adult) (pediatric): Secondary | ICD-10-CM | POA: Insufficient documentation

## 2023-01-12 DIAGNOSIS — R400 Somnolence: Secondary | ICD-10-CM | POA: Insufficient documentation

## 2023-01-17 DIAGNOSIS — R400 Somnolence: Secondary | ICD-10-CM | POA: Diagnosis not present

## 2023-01-17 NOTE — Procedures (Signed)
    Patient Name: Desiree Marshall, Desiree Marshall Date: 01/12/2023 Gender: Female D.O.B: 10-06-1980 Age (years): 41 Referring Provider: Mosetta Putt Height (inches): 59 Interpreting Physician: Jetty Duhamel MD, ABSM Weight (lbs): 157 RPSGT: [place] BMI: 32 MRN: [id number] Neck Size: 12.00  CLINICAL INFORMATION Sleep Study Type: HST Indication for sleep study: Daytime somnolence Epworth Sleepiness Score: 12  SLEEP STUDY TECHNIQUE A multi-channel overnight portable sleep study was performed. The channels recorded were: nasal airflow, thoracic respiratory movement, and oxygen saturation with a pulse oximetry. Snoring was also monitored.  MEDICATIONS Patient self administered medications include: none reported.  SLEEP ARCHITECTURE Patient was studied for 364.5 minutes. The sleep efficiency was 100.0 % and the patient was supine for 0%. The arousal index was 0.0 per hour.  RESPIRATORY PARAMETERS The overall AHI was 5.1 per hour, with a central apnea index of 0 per hour. The oxygen nadir was 77% during sleep.  CARDIAC DATA Mean heart rate during sleep was 77.7 bpm.  IMPRESSIONS - Minimal obstructive sleep apnea occurred during this study (AHI = 5.1/h). - Oxygen desaturation was noted during this study (Min O2 = 77%, Mean 97%). - Patient snored.  DIAGNOSIS - Obstructive Sleep Apnea (G47.33)  RECOMMENDATIONS - Borderline abnormal AHI. Treatment options guided by symptoms and co-morbidity. Conservative measures may include observation, weight loss and sleep postion off back. Other options, including CPAP or a fitted oral appliance, would be based on clinical judgment. - Be careful with alcohol, sedatives and other CNS depressants that may worsen sleep apnea and disrupt normal sleep architecture. - Sleep hygiene should be reviewed to assess factors that may improve sleep quality. - Weight management and regular exercise should be initiated or continued.  [Electronically signed]  01/17/2023 11:51 AM  Jetty Duhamel MD, ABSM Diplomate, American Board of Sleep Medicine NPI: [id number]                         Jetty Duhamel Diplomate, American Board of Sleep Medicine  ELECTRONICALLY SIGNED ON:  01/17/2023, 11:45 AM [facility] PH: [phone]   FX: [phone] ACCREDITED BY THE AMERICAN ACADEMY OF SLEEP MEDICINE

## 2023-02-12 ENCOUNTER — Telehealth (INDEPENDENT_AMBULATORY_CARE_PROVIDER_SITE_OTHER): Payer: 59 | Admitting: Neurology

## 2023-02-12 ENCOUNTER — Encounter: Payer: Self-pay | Admitting: Neurology

## 2023-02-12 VITALS — Ht 59.0 in | Wt 155.0 lb

## 2023-02-12 DIAGNOSIS — G40009 Localization-related (focal) (partial) idiopathic epilepsy and epileptic syndromes with seizures of localized onset, not intractable, without status epilepticus: Secondary | ICD-10-CM

## 2023-02-12 MED ORDER — LAMOTRIGINE ER 200 MG PO TB24: 2.0000 | ORAL_TABLET | Freq: Every morning | ORAL | 3 refills | Status: DC

## 2023-02-12 NOTE — Progress Notes (Signed)
Virtual Visit via Video Note The purpose of this virtual visit is to provide medical care while limiting exposure to the novel coronavirus.    Consent was obtained for video visit:  Yes.   Answered questions that patient had about telehealth interaction:  Yes.     Pt location: Home Physician Location: office Name of referring provider:  Mosetta Putt, MD I connected with Jennings Books at patients initiation/request on 02/12/2023 at  8:30 AM EDT by video enabled telemedicine application and verified that I am speaking with the correct person using two identifiers. Pt MRN:  [id number] Pt DOB:  1981-09-11 Video Participants:  [employer]enn[employer]   History of Present Illness:  The patient had a virtual video visit on 02/12/2023. She had called our office to report that she had an upper respiratory infection and requested for a virtual visit. She was last seen a year ago for seizures and migraines. Since her last visit, she continues to deny any seizures or seizure-like symptoms since 2009 on Lamotrigine ER 400mg  every morning. No side effects. She denies any staring/unresponsive episodes, gaps in time, olfactory/gustatory hallucinations, focal numbness/tingling/weakness, myoclonic jerks. Migraines are still around the time of her period. She had some muscle stiffness with triptans, but seemed to tolerate the one dose she took of Eletriptan which did help. She was previously having cough headaches, these have recurred with current respiratory infection. She denies any dizziness, vision changes, no falls. She is on the same oral contraceptive. Sleep and mood are good. No pregnancy plans. On her last visit a year ago, she reported a change in medication for Crohn's. We checked Lamictal level on 03/14/22 which was 2.7. Repeat level on 03/26/22 with no dose change was 7.4.  History on Initial Assessment 11/03/2017: This is a pleasant 42 yo RH woman with a history of Crohn's disease, migraines, and  seizures. She had been seeing neurologist Dr. Terrace Arabia, records were reviewed. She started having bad migraines in high school, usually with left frontal pressure. She used to have nausea/vomiting, none since having Imitrex available. No photo/phonophobia. They usually occur with her menstrual period, she usually gets 3-4 migraines a month during the time of her menses. No side effects on Imitrex. She is concerned about a different type of pain this past year, when she laughs, sneezes, or bears down, she would have a brief diffuse sharp pain in her head like she is dizzy, then goes away after a second. There is no associated confusion, focal numbness/tingling/weakness, nausea/vomiting, visual obscurations. Poor sleep is a trigger for her migraines. No family history of migraines. She denies any tinnitus, diplopia, dysarthria/dysphagia, neck pain. She has occasional dizziness upon standing a couple of times a month lasting a few minutes. She usually gets 6-7 hours of sleep. No history of head injuries.    Seizures started in her teens, she would have speech difficulties where she thinks a word but can't say it. She would feel "foggy brain," then have a generalized convulsion. She has had only 3 seizures in her lifetime, the second occurred when she was tried to wean off Tegretol, and the last seizure occurred in 2009 when she missed a dose of Lamotrigine. She is taking Lamotrigine XR 200mg  daily without side effects.  She denies any olfactory/gustatory hallucinations, deja vu, rising epigastric sensation, focal numbness/tingling/weakness, myoclonic jerks.   Epilepsy Risk Factors:  She had a normal birth and early development.  There is no history of febrile convulsions, CNS infections such as meningitis/encephalitis, significant  traumatic brain injury, neurosurgical procedures, or family history of seizures.   Diagnostic Data: EEG in 04/2016 was normal. 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 Lamictal level 11/2016: 5.0 CC: Dr. Duaine Dredge   Current Outpatient Medications on File Prior to Visit  Medication Sig Dispense Refill   azaTHIOprine (IMURAN) 50 MG tablet Take 100 mg by mouth daily.     ibuprofen (ADVIL,MOTRIN) 800 MG tablet Take 1 tablet (800 mg total) by mouth 3 (three) times daily. 30 tablet 0   LamoTRIgine 200 MG TB24 24 hour tablet Take 2 tablets (400 mg total) by mouth every morning. 180 tablet 3   loratadine (CLARITIN) 10 MG tablet Take 10 mg by mouth daily.     norethindrone-ethinyl estradiol-iron (ESTROSTEP FE) 1-20/1-30/1-35 MG-MCG tablet Take 1 tablet by mouth daily.     Risankizumab-rzaa (SKYRIZI) 360 MG/2.4ML SOCT Inject 360mg  with OBI at week 12 post completion of IV loading doses, then every 8weeks maintenance     No current facility-administered medications on file prior to visit.     Observations/Objective:   Vitals:   02/12/23 0808  Weight: 155 lb (70.3 kg)  Height: 4\' 11"  (1.499 m)   GEN:  The patient appears stated age and is in NAD.  Neurological examination: Patient is awake, alert. No aphasia or dysarthria. Intact fluency and comprehension.Cranial nerves: Extraocular movements intact. No facial asymmetry. Motor: moves all extremities symmetrically, at least anti-gravity x 4.    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 She has had only 3 seizures in her lifetime, and has been seizure-free since 2009 on Lamotrigine ER 400mg  daily, refills sent. There was a difference in Lamictal levels in June 2023, last level was 7.4. We discussed repeating another Lamictal level for baseline. Catamenial migraines stable, she is having more cough headaches with current respiratory infection. She is aware of  [place] driving laws to stop driving after a seizure until 6 months seizure-free. Follow-up in 1 year, call for any changes.    Follow Up Instructions:   -I discussed the assessment and treatment plan with the patient. The patient was provided an opportunity to ask questions and all were answered. The patient agreed with the plan and demonstrated an understanding of the instructions.   The patient was advised to call back or seek an in-person evaluation if the symptoms worsen or if the condition fails to improve as anticipated.    Van Clines, MD

## 2023-02-12 NOTE — Patient Instructions (Signed)
Good to see you. I hope you feel better soon.  Have bloodwork done for Lamictal level  2. Continue Lamictal XR 200mg : Take 2 tablets every morning  3. Follow-up in 1 year, call for any changes.    Seizure Precautions: 1. If medication has been prescribed for you to prevent seizures, take it exactly as directed.  Do not stop taking the medicine without talking to your doctor first, even if you have not had a seizure in a long time.   2. Avoid activities in which a seizure would cause danger to yourself or to others.  Don't operate dangerous machinery, swim alone, or climb in high or dangerous places, such as on ladders, roofs, or girders.  Do not drive unless your doctor says you may.  3. If you have any warning that you may have a seizure, lay down in a safe place where you can't hurt yourself.    4.  No driving for 6 months from last seizure, as per [facility].   Please refer to the following link on the Epilepsy Foundation of America's website for more information: http://www.epilepsyfoundation.org/answerplace/Social/driving/drivingu.cfm   5.  Maintain good sleep hygiene. Avoid alcohol.  6.  Notify your neurology if you are planning pregnancy or if you become pregnant.  7.  Contact your doctor if you have any problems that may be related to the medicine you are taking.  8.  Call 911 and bring the patient back to the ED if:        A.  The seizure lasts longer than 5 minutes.       B.  The patient doesn't awaken shortly after the seizure  C.  The patient has new problems such as difficulty seeing, speaking or moving  D.  The patient was injured during the seizure  E.  The patient has a temperature over 102 F (39C)  F.  The patient vomited and now is having trouble breathing

## 2023-05-15 ENCOUNTER — Telehealth: Payer: Self-pay

## 2023-05-15 NOTE — Telephone Encounter (Signed)
-----   Message from Van Clines sent at 05/15/2023 12:18 PM EDT ----- Pls let her know Lamictal level pretty good, 9.6, continue same dose of medication, thanks

## 2023-05-15 NOTE — Telephone Encounter (Signed)
Pt called informed that Lamictal level pretty good, 9.6, continue same dose of medication

## 2023-08-14 ENCOUNTER — Encounter: Payer: Self-pay | Admitting: Neurology

## 2024-01-06 ENCOUNTER — Ambulatory Visit (INDEPENDENT_AMBULATORY_CARE_PROVIDER_SITE_OTHER): Payer: 59 | Admitting: Neurology

## 2024-01-06 ENCOUNTER — Encounter: Payer: Self-pay | Admitting: Neurology

## 2024-01-06 VITALS — BP 105/71 | HR 90 | Ht 59.0 in | Wt 143.0 lb

## 2024-01-06 DIAGNOSIS — G43829 Menstrual migraine, not intractable, without status migrainosus: Secondary | ICD-10-CM

## 2024-01-06 DIAGNOSIS — G40009 Localization-related (focal) (partial) idiopathic epilepsy and epileptic syndromes with seizures of localized onset, not intractable, without status epilepticus: Secondary | ICD-10-CM | POA: Diagnosis not present

## 2024-01-06 MED ORDER — LAMOTRIGINE ER 200 MG PO TB24: 2.0000 | ORAL_TABLET | Freq: Every morning | ORAL | 4 refills | Status: AC

## 2024-01-06 NOTE — Patient Instructions (Signed)
 Always a pleasure to see you. Continue Lamotrigine ER 200mg : take 2 tablets daily. Follow-up in 1 year, call for any changes.    Seizure Precautions: 1. If medication has been prescribed for you to prevent seizures, take it exactly as directed.  Do not stop taking the medicine without talking to your doctor first, even if you have not had a seizure in a long time.   2. Avoid activities in which a seizure would cause danger to yourself or to others.  Don't operate dangerous machinery, swim alone, or climb in high or dangerous places, such as on ladders, roofs, or girders.  Do not drive unless your doctor says you may.  3. If you have any warning that you may have a seizure, lay down in a safe place where you can't hurt yourself.    4.  No driving for 6 months from last seizure, as per [facility].   Please refer to the following link on the Epilepsy Foundation of America's website for more information: http://www.epilepsyfoundation.org/answerplace/Social/driving/drivingu.cfm   5.  Maintain good sleep hygiene. Avoid alcohol.  6.  Notify your neurology if you are planning pregnancy or if you become pregnant.  7.  Contact your doctor if you have any problems that may be related to the medicine you are taking.  8.  Call 911 and bring the patient back to the ED if:        A.  The seizure lasts longer than 5 minutes.       B.  The patient doesn't awaken shortly after the seizure  C.  The patient has new problems such as difficulty seeing, speaking or moving  D.  The patient was injured during the seizure  E.  The patient has a temperature over 102 F (39C)  F.  The patient vomited and now is having trouble breathing

## 2024-01-06 NOTE — Progress Notes (Signed)
 NEUROLOGY FOLLOW UP OFFICE NOTE  Desiree Marshall [id number] 1981-09-22  HISTORY OF PRESENT ILLNESS: I had the pleasure of seeing Desiree Marshall in follow-up in the neurology clinic on 01/06/2024.  The patient was last seen a year ago for seizures and migraines. Her 41 year old son [name] is present with her. Records and images were personally reviewed where available.  Since her last visit, she reports doing well, seizure-free since 2009 on Lamotrigine ER 400mg  every morning, no side effects. She denies any staring/unresponsive episodes, gaps in time, olfactory/gustatory hallucinations, focal numbness/tingling/weakness, myoclonic jerks. Sometimes her fingers feel numb. Migraines are still around her period. She has prn sumatriptan prescribed by her PCP which helps but does make her sore. She had some cough headaches while sick in the winter, this is infrequent now. No dizziness, vision changes, no falls. No pregnancy plans.  Lamotrigine level 04/2023: 9.6  History on Initial Assessment 11/03/2017: This is a pleasant 43 yo RH woman with a history of Crohn's disease, migraines, and seizures. She had been seeing neurologist Dr. Terrace Arabia, records were reviewed. She started having bad migraines in high school, usually with left frontal pressure. She used to have nausea/vomiting, none since having Imitrex available. No photo/phonophobia. They usually occur with her menstrual period, she usually gets 3-4 migraines a month during the time of her menses. No side effects on Imitrex. She is concerned about a different type of pain this past year, when she laughs, sneezes, or bears down, she would have a brief diffuse sharp pain in her head like she is dizzy, then goes away after a second. There is no associated confusion, focal numbness/tingling/weakness, nausea/vomiting, visual obscurations. Poor sleep is a trigger for her migraines. No family history of migraines. She denies any tinnitus, diplopia, dysarthria/dysphagia,  neck pain. She has occasional dizziness upon standing a couple of times a month lasting a few minutes. She usually gets 6-7 hours of sleep. No history of head injuries.    Seizures started in her teens, she would have speech difficulties where she thinks a word but can't say it. She would feel "foggy brain," then have a generalized convulsion. She has had only 3 seizures in her lifetime, the second occurred when she was tried to wean off Tegretol, and the last seizure occurred in 2009 when she missed a dose of Lamotrigine. She is taking Lamotrigine XR 200mg  daily without side effects.  She denies any olfactory/gustatory hallucinations, deja vu, rising epigastric sensation, focal numbness/tingling/weakness, myoclonic jerks.   Epilepsy Risk Factors:  She had a normal birth and early development.  There is no history of febrile convulsions, CNS infections such as meningitis/encephalitis, significant traumatic brain injury, neurosurgical procedures, or family history of seizures.   Diagnostic Data: EEG in 04/2016 was normal. 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 Lamictal level 11/2016: 5.0   PAST MEDICAL HISTORY: Past Medical History:  Diagnosis Date   Anemia    Resolved   Astigmatism    Chronic kidney disease    Crohn's disease (HCC)    Eczema    Epilepsy (HCC)    Diagnosed at the age of 50.    Gallstone    Kidney stone    Migraine    Seizures (HCC)     MEDICATIONS: Current Outpatient Medications on File Prior to Visit  Medication Sig Dispense Refill  ibuprofen (ADVIL,MOTRIN) 800 MG tablet Take 1 tablet (800 mg total) by mouth 3 (three) times daily. 30 tablet 0   LamoTRIgine 200 MG TB24 24 hour tablet Take 2 tablets (400 mg total) by mouth every morning. 180 tablet 3   loratadine (CLARITIN)  10 MG tablet Take 10 mg by mouth daily.     norethindrone-ethinyl estradiol-iron (ESTROSTEP FE) 1-20/1-30/1-35 MG-MCG tablet Take 1 tablet by mouth daily.     Risankizumab-rzaa (SKYRIZI) 360 MG/2.4ML SOCT Inject 360mg  with OBI at week 12 post completion of IV loading doses, then every 8weeks maintenance     No current facility-administered medications on file prior to visit.    ALLERGIES: Allergies  Allergen Reactions   Promethazine Hcl Other (See Comments)    Pt states that this medication causes hallucinations.    Metoclopramide Other (See Comments)    Patient had pretty severe akathisia with 10 mg IV.  Use low-dose with this patient, consider 2.5 mg and be ready to give Benadryl.    FAMILY HISTORY: Family History  Problem Relation Age of Onset   Asthma Mother    Hypertension Mother    Allergic rhinitis Mother    Cancer Father 70       Prostate Cancer   Heart disease Maternal Grandmother    Heart disease Maternal Grandfather    Stroke Paternal Grandmother    Heart disease Paternal Grandfather    Allergic rhinitis Brother    Angioedema Neg Hx    Atopy Neg Hx    Eczema Neg Hx    Immunodeficiency Neg Hx    Urticaria Neg Hx     SOCIAL HISTORY: Social History   Socioeconomic History   Marital status: Married    Spouse name: Animal nutritionist    Number of children: 1   Years of education: Masters   Highest education level: Not on file  Occupational History   Occupation: HOMEMAKER   Tobacco Use   Smoking status: Never   Smokeless tobacco: Never  Vaping Use   Vaping status: Never Used  Substance and Sexual Activity   Alcohol use: Not Currently   Drug use: Never   Sexual activity: Yes    Partners: Male    Birth control/protection: None  Other Topics Concern   Not on file  Social History Narrative   ** Merged History Encounter **       Marital Status:  Married Estate agent)    Children:  2 Sons ([place] and [place])    Pets: Dog (1)    Living Situation: Lives with husband and  son   Occupation: Futures trader    Education: Manufacturing engineer in Nursing Administration       Tobacco Use/Exposure:  None    Alc   ohol Use:  Occasional   Drug Use:  None   Diet:  Regular   Exercise:  Walking - once weekly    Hobbies: Baking - Sewing    Right-handed.   2 cups caffeine daily.   Two story home            Social Drivers of Health   Financial Resource Strain: Low Risk  (08/17/2018)   Overall Financial Resource Strain (CARDIA)    Difficulty of Paying Living Expenses: Not hard at all  Food Insecurity: Unknown (08/17/2018)   Hunger Vital Sign    Worried About Running Out of Food in the Last Year: Patient declined    Ran Out of Food in the Last Year: Patient declined  Transportation Needs: Unknown (08/17/2018)  PRAPARE - Administrator, Civil Service (Medical): Patient declined    Lack of Transportation (Non-Medical): Patient declined  Physical Activity: Not on file  Stress: Not on file  Social Connections: Not on file  Intimate Partner Violence: Not on file     PHYSICAL EXAM: Vitals:   01/06/24 1123  BP: 105/71  Pulse: 90  SpO2: 99%   General: No acute distress Head:  Normocephalic/atraumatic Skin/Extremities: No rash, no edema Neurological Exam: alert and awake. No aphasia or dysarthria. Fund of knowledge is appropriate.  Attention and concentration are normal.   Cranial nerves: Pupils equal, round. Extraocular movements intact with no nystagmus. Visual fields full.  No facial asymmetry.  Motor: Bulk and tone normal, muscle strength 5/5 throughout with no pronator drift.   Finger to nose testing intact.  Gait narrow-based and steady, able to tandem walk adequately.  Romberg negative.   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 She has had 3 seizures in her lifetime, none since 2009 on Lamotrigine ER 400mg  daily. Catamenial migraines stable, she has prn sumatriptan for  migraine rescue. She is aware of [place] driving laws to stop driving after a seizure until 6 months seizure-free. Follow-up in 1 year, call for any changes.    Thank you for allowing me to participate in her care.  Please do not hesitate to call for any questions or concerns.    Patrcia Dolly, M.D.   CC: Dr. Duaine Dredge

## 2024-01-13 ENCOUNTER — Ambulatory Visit: Payer: 59 | Admitting: Neurology

## 2024-01-19 ENCOUNTER — Other Ambulatory Visit: Payer: Self-pay | Admitting: Obstetrics and Gynecology

## 2024-01-19 DIAGNOSIS — N6321 Unspecified lump in the left breast, upper outer quadrant: Secondary | ICD-10-CM

## 2024-02-02 ENCOUNTER — Ambulatory Visit
Admission: RE | Admit: 2024-02-02 | Discharge: 2024-02-02 | Disposition: A | Discharge status: Home / Self Care | Source: Ambulatory Visit | Attending: Obstetrics and Gynecology | Admitting: Obstetrics and Gynecology

## 2024-02-02 DIAGNOSIS — N6321 Unspecified lump in the left breast, upper outer quadrant: Secondary | ICD-10-CM

## 2024-08-12 ENCOUNTER — Encounter: Payer: Self-pay | Admitting: Neurology

## 2024-08-22 ENCOUNTER — Encounter: Payer: Self-pay | Admitting: Neurology

## 2025-01-05 ENCOUNTER — Telehealth: Admitting: Neurology

## 2025-01-09 ENCOUNTER — Telehealth: Admitting: Neurology
